# Patient Record
Sex: Female | Born: 1965
Health system: Southern US, Community
[De-identification: ages and names within clinical notes are randomized; demographics above are authoritative.]

## PROBLEM LIST (undated history)

## (undated) DIAGNOSIS — E782 Mixed hyperlipidemia: Secondary | ICD-10-CM

## (undated) DIAGNOSIS — D509 Iron deficiency anemia, unspecified: Secondary | ICD-10-CM

## (undated) DIAGNOSIS — M2550 Pain in unspecified joint: Secondary | ICD-10-CM

## (undated) DIAGNOSIS — M549 Dorsalgia, unspecified: Secondary | ICD-10-CM

## (undated) DIAGNOSIS — F419 Anxiety disorder, unspecified: Secondary | ICD-10-CM

## (undated) DIAGNOSIS — F32A Depression, unspecified: Secondary | ICD-10-CM

## (undated) DIAGNOSIS — K219 Gastro-esophageal reflux disease without esophagitis: Secondary | ICD-10-CM

## (undated) DIAGNOSIS — F40232 Fear of other medical care: Secondary | ICD-10-CM

## (undated) DIAGNOSIS — F329 Major depressive disorder, single episode, unspecified: Secondary | ICD-10-CM

## (undated) HISTORY — DX: Gastro-esophageal reflux disease without esophagitis: K21.9

## (undated) HISTORY — DX: Pain in unspecified joint: M25.50

## (undated) HISTORY — DX: Major depressive disorder, single episode, unspecified: F32.9

## (undated) HISTORY — DX: Fear of other medical care: F40.232

## (undated) HISTORY — DX: Mixed hyperlipidemia: E78.2

## (undated) HISTORY — DX: Anxiety disorder, unspecified: F41.9

## (undated) HISTORY — DX: Dorsalgia, unspecified: M54.9

## (undated) HISTORY — DX: Depression, unspecified: F32.A

## (undated) HISTORY — DX: Iron deficiency anemia, unspecified: D50.9

---

## 1999-09-06 ENCOUNTER — Other Ambulatory Visit: Admission: RE | Admit: 1999-09-06 | Discharge: 1999-09-06 | Payer: Self-pay | Admitting: Obstetrics and Gynecology

## 1999-10-19 ENCOUNTER — Encounter: Admission: RE | Admit: 1999-10-19 | Discharge: 2000-01-17 | Payer: Self-pay | Admitting: Obstetrics and Gynecology

## 2000-09-05 ENCOUNTER — Other Ambulatory Visit: Admission: RE | Admit: 2000-09-05 | Discharge: 2000-09-05 | Payer: Self-pay | Admitting: Obstetrics and Gynecology

## 2001-09-10 ENCOUNTER — Other Ambulatory Visit: Admission: RE | Admit: 2001-09-10 | Discharge: 2001-09-10 | Payer: Self-pay | Admitting: Obstetrics and Gynecology

## 2002-09-30 ENCOUNTER — Other Ambulatory Visit: Admission: RE | Admit: 2002-09-30 | Discharge: 2002-09-30 | Payer: Self-pay | Admitting: Obstetrics and Gynecology

## 2003-10-17 ENCOUNTER — Other Ambulatory Visit: Admission: RE | Admit: 2003-10-17 | Discharge: 2003-10-17 | Payer: Self-pay | Admitting: Obstetrics and Gynecology

## 2003-10-24 ENCOUNTER — Ambulatory Visit (HOSPITAL_COMMUNITY): Admission: RE | Admit: 2003-10-24 | Discharge: 2003-10-24 | Payer: Self-pay | Admitting: Obstetrics and Gynecology

## 2004-10-19 ENCOUNTER — Other Ambulatory Visit: Admission: RE | Admit: 2004-10-19 | Discharge: 2004-10-19 | Payer: Self-pay | Admitting: Obstetrics and Gynecology

## 2009-09-16 ENCOUNTER — Encounter: Admission: RE | Admit: 2009-09-16 | Discharge: 2009-09-16 | Payer: Self-pay | Admitting: Family Medicine

## 2010-09-21 ENCOUNTER — Other Ambulatory Visit (HOSPITAL_COMMUNITY): Payer: Self-pay | Admitting: Family Medicine

## 2010-09-21 DIAGNOSIS — Z1231 Encounter for screening mammogram for malignant neoplasm of breast: Secondary | ICD-10-CM

## 2010-09-30 ENCOUNTER — Ambulatory Visit (HOSPITAL_COMMUNITY)
Admission: RE | Admit: 2010-09-30 | Discharge: 2010-09-30 | Disposition: A | Discharge status: Home / Self Care | Payer: 59 | Source: Ambulatory Visit | Attending: Family Medicine | Admitting: Family Medicine

## 2010-09-30 DIAGNOSIS — Z1231 Encounter for screening mammogram for malignant neoplasm of breast: Secondary | ICD-10-CM | POA: Insufficient documentation

## 2011-03-15 IMAGING — US US ABDOMEN COMPLETE
1 series · 14 of 25 positions shown · non-contrast
Comparison: None.

CLINICAL DATA: Elevated LFTs.

ABDOMINAL ULTRASOUND COMPLETE

[Series 1: us abdomen complete · 0.23mm/px · 14 of 59 slices shown]
[im 1/59]
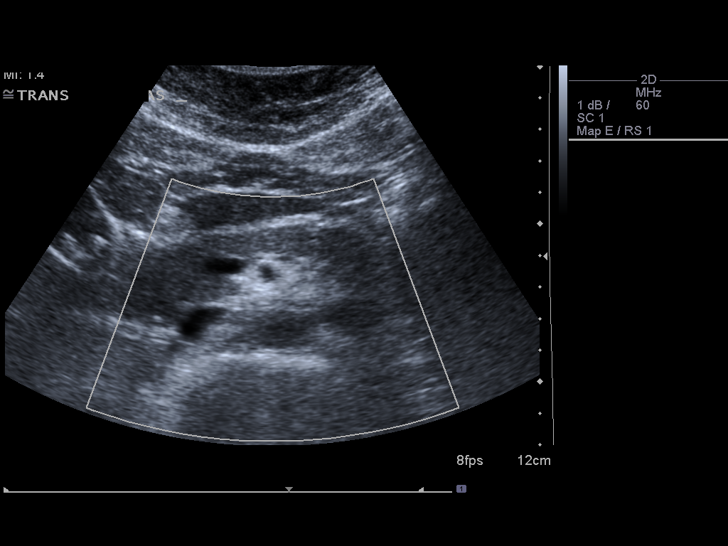
[im 5/59]
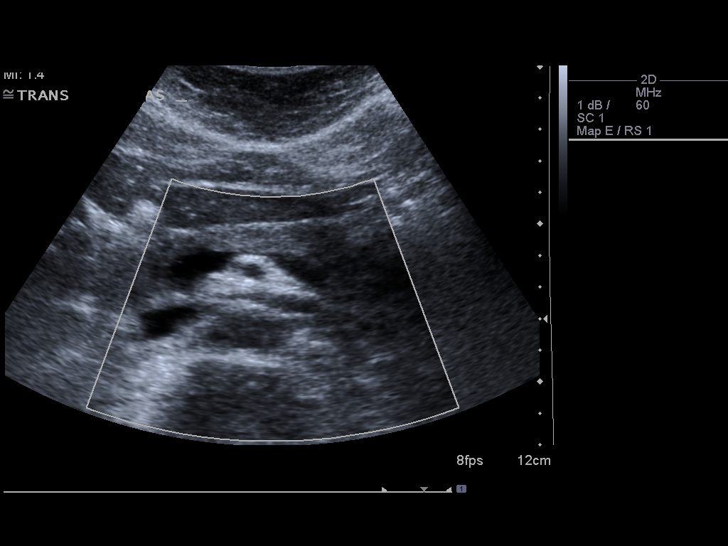
[im 10/59]
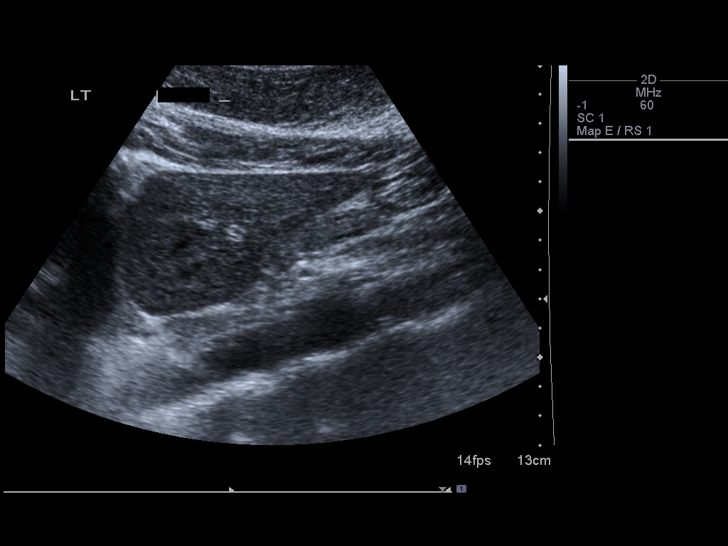
[im 15/59]
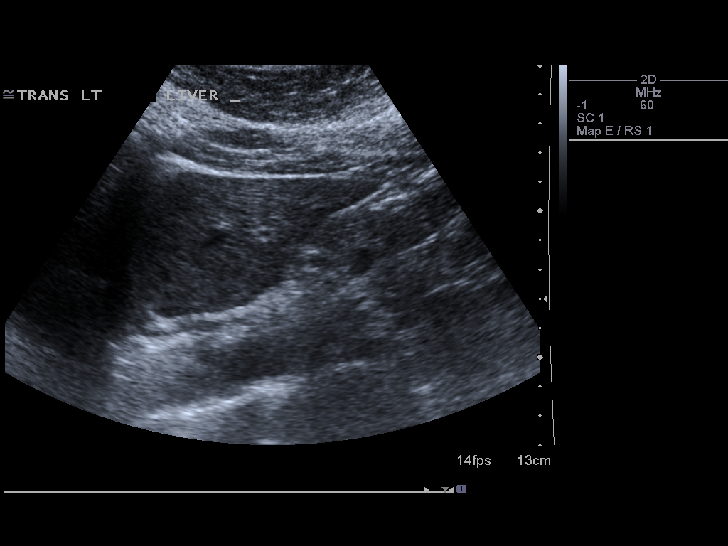
[im 20/59]
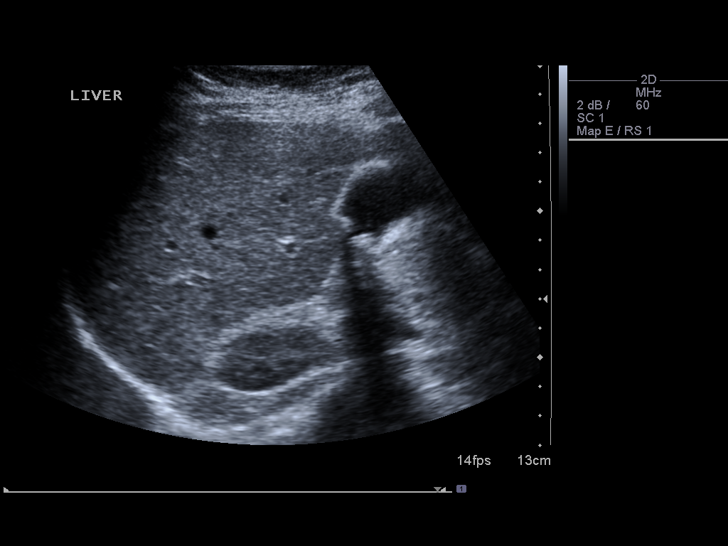
[im 22/59]
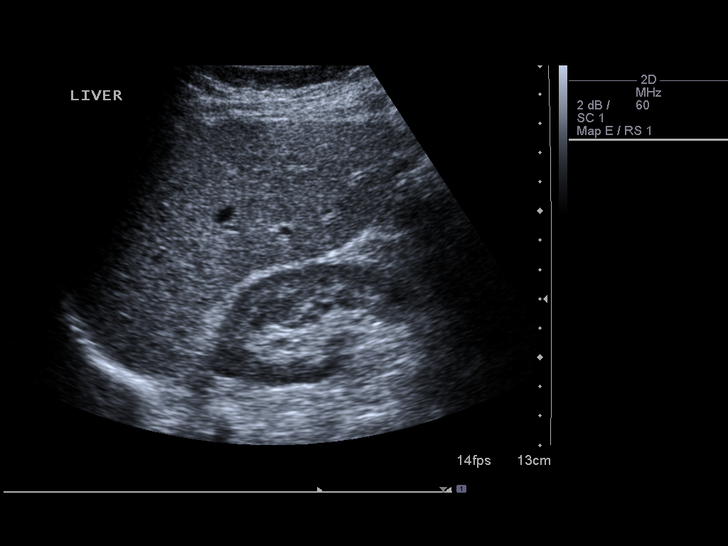
[im 27/59]
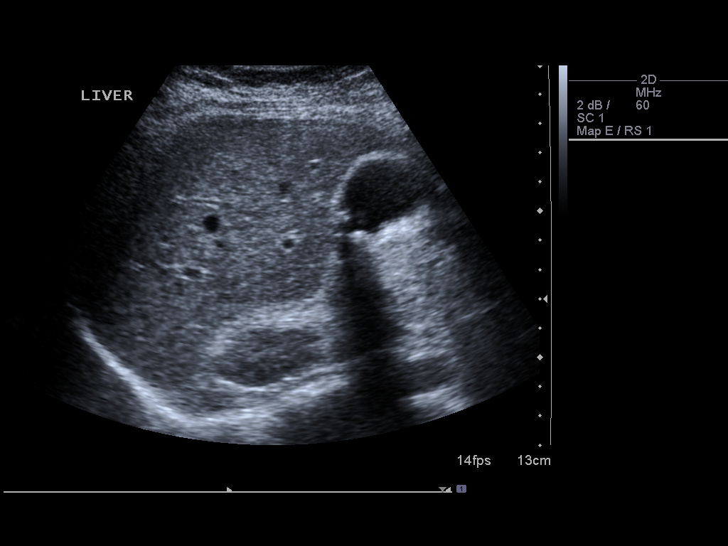
[im 32/59]
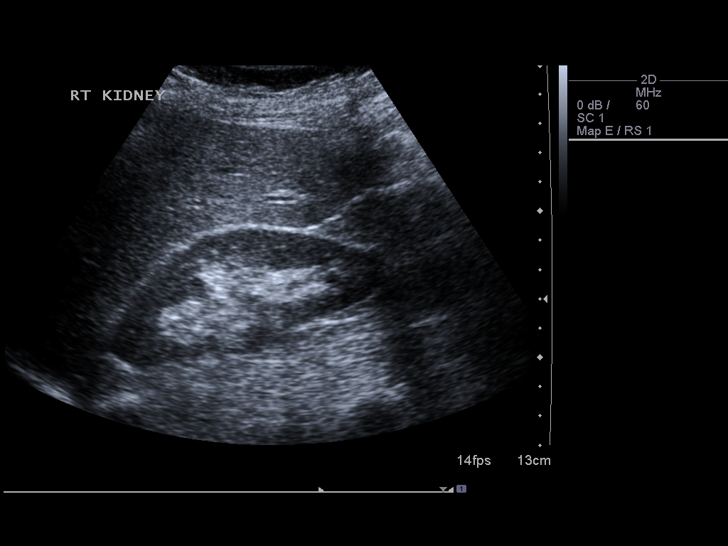
[im 37/59]
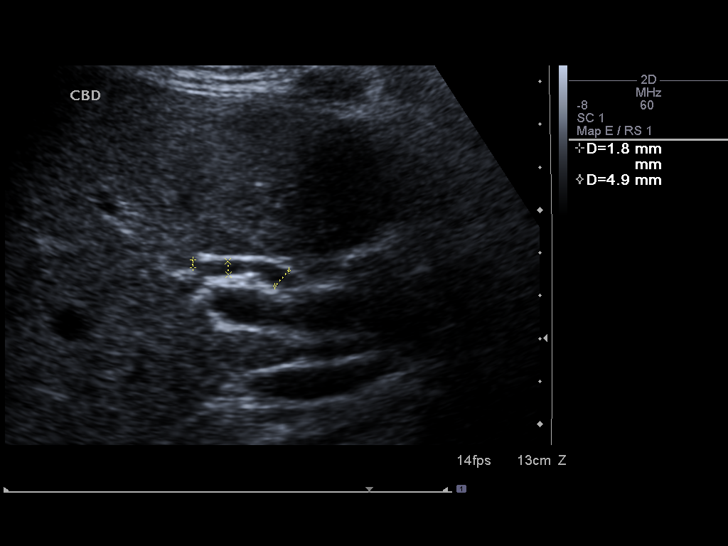
[im 39/59]
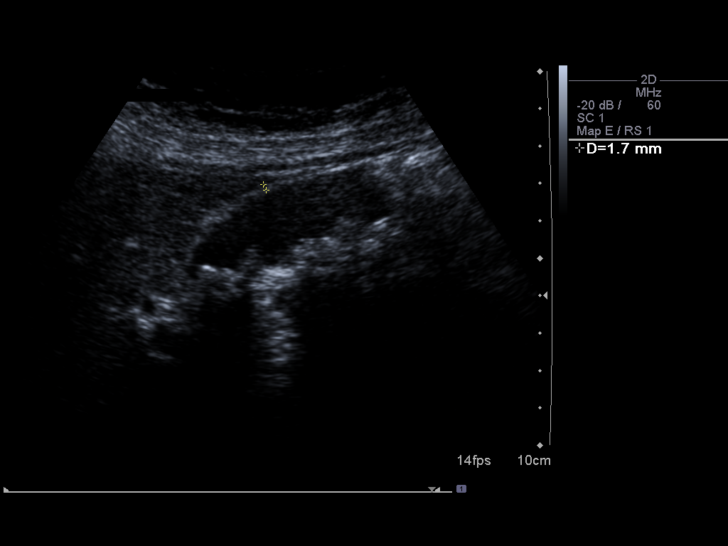
[im 44/59]
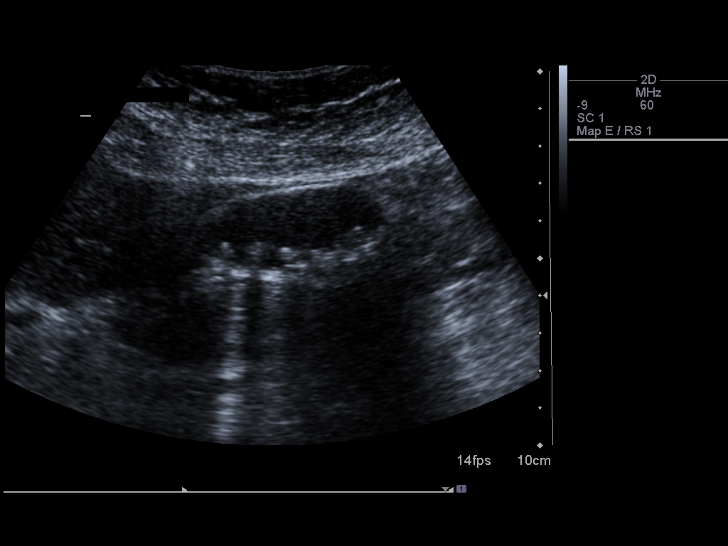
[im 49/59]
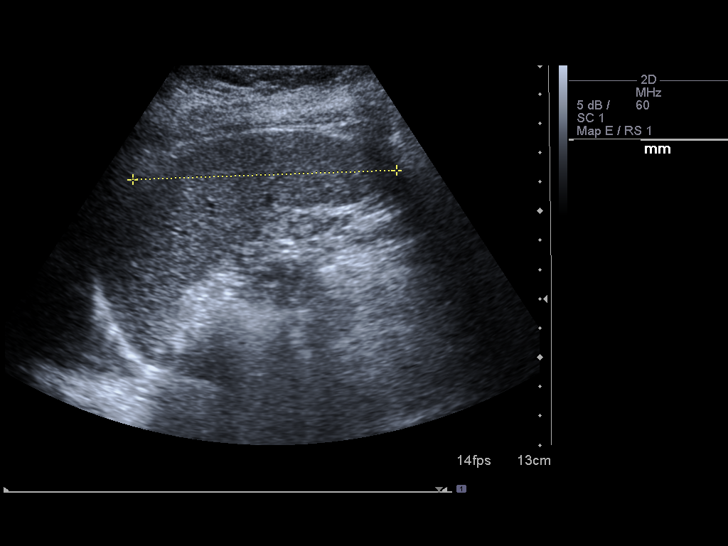
[im 54/59]
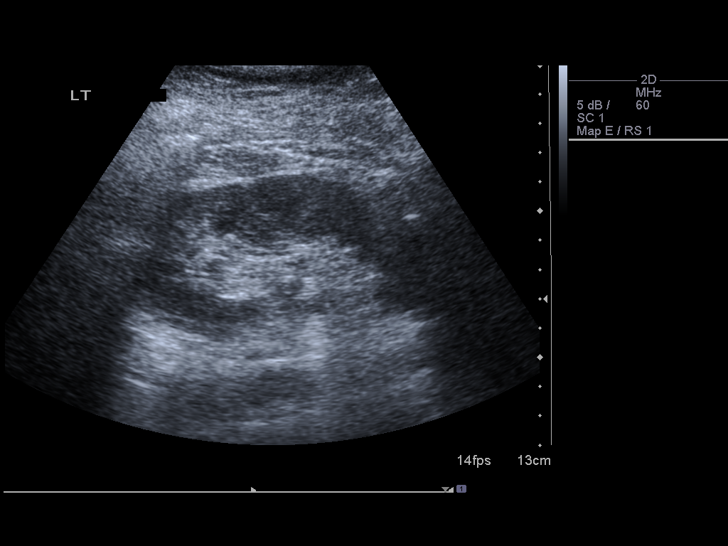
[im 59/59]
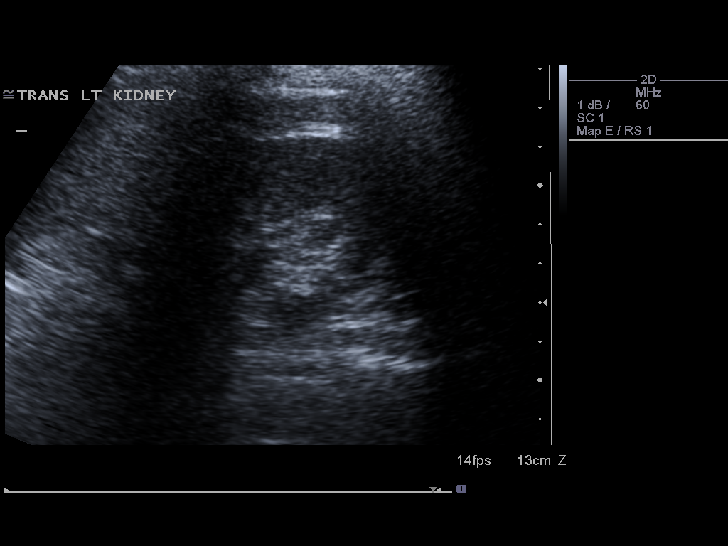

[14 of 25 positions shown; findings below may reference images not displayed]

FINDINGS: Gallbladder: Multiple gallstones.  Negative ultrasound Murphy's
sign.

Common Bile Duct:  Within normal limits in caliber. 4.9 mm CBD
diameter.

Liver:  No focal mass lesion identified.  Within normal limits in
parenchymal echogenicity.

IVC:  Appears normal.

Pancreas:  No abnormality identified, although entire pancreas
cannot be visualized by ultrasound.

Spleen:  Within normal limits in size and echotexture. 9.0 cm
splenic length.

Right kidney:  Normal in size (renal length is 9.6 cm) and
parenchymal echogenicity.  No evidence of mass or hydronephrosis.

Left kidney:  Normal in size (renal length is 10.4 cm) and
parenchymal echogenicity.  No evidence of mass or hydronephrosis.

Abdominal Aorta:  No aneurysm identified. Maximum diameter of the
abdominal aorta is 1.9 cm.
IMPRESSION: Cholelithiasis.  No biliary ductal dilatation.

## 2011-11-29 ENCOUNTER — Other Ambulatory Visit (HOSPITAL_COMMUNITY): Payer: Self-pay | Admitting: Family Medicine

## 2011-11-29 DIAGNOSIS — Z1231 Encounter for screening mammogram for malignant neoplasm of breast: Secondary | ICD-10-CM

## 2011-12-14 ENCOUNTER — Ambulatory Visit (HOSPITAL_COMMUNITY)
Admission: RE | Admit: 2011-12-14 | Discharge: 2011-12-14 | Disposition: A | Discharge status: Home / Self Care | Payer: 59 | Source: Ambulatory Visit | Attending: Family Medicine | Admitting: Family Medicine

## 2011-12-14 DIAGNOSIS — Z1231 Encounter for screening mammogram for malignant neoplasm of breast: Secondary | ICD-10-CM | POA: Insufficient documentation

## 2012-12-12 ENCOUNTER — Other Ambulatory Visit (HOSPITAL_COMMUNITY): Payer: Self-pay | Admitting: Family Medicine

## 2012-12-12 DIAGNOSIS — Z1231 Encounter for screening mammogram for malignant neoplasm of breast: Secondary | ICD-10-CM

## 2012-12-25 ENCOUNTER — Ambulatory Visit (HOSPITAL_COMMUNITY)
Admission: RE | Admit: 2012-12-25 | Discharge: 2012-12-25 | Disposition: A | Discharge status: Home / Self Care | Payer: 59 | Source: Ambulatory Visit | Attending: Family Medicine | Admitting: Family Medicine

## 2012-12-25 DIAGNOSIS — Z1231 Encounter for screening mammogram for malignant neoplasm of breast: Secondary | ICD-10-CM

## 2014-01-15 ENCOUNTER — Other Ambulatory Visit (HOSPITAL_COMMUNITY): Payer: Self-pay | Admitting: Family Medicine

## 2014-01-15 DIAGNOSIS — Z1231 Encounter for screening mammogram for malignant neoplasm of breast: Secondary | ICD-10-CM

## 2014-01-28 ENCOUNTER — Ambulatory Visit (HOSPITAL_COMMUNITY)
Admission: RE | Admit: 2014-01-28 | Discharge: 2014-01-28 | Disposition: A | Discharge status: Home / Self Care | Payer: 59 | Source: Ambulatory Visit | Attending: Family Medicine | Admitting: Family Medicine

## 2014-01-28 DIAGNOSIS — Z1231 Encounter for screening mammogram for malignant neoplasm of breast: Secondary | ICD-10-CM

## 2015-01-20 ENCOUNTER — Other Ambulatory Visit: Payer: Self-pay | Admitting: Family Medicine

## 2015-01-20 DIAGNOSIS — Z1231 Encounter for screening mammogram for malignant neoplasm of breast: Secondary | ICD-10-CM

## 2015-02-02 ENCOUNTER — Ambulatory Visit
Admission: RE | Admit: 2015-02-02 | Discharge: 2015-02-02 | Disposition: A | Discharge status: Home / Self Care | Payer: 59 | Source: Ambulatory Visit | Attending: Family Medicine | Admitting: Family Medicine

## 2015-02-02 DIAGNOSIS — Z1231 Encounter for screening mammogram for malignant neoplasm of breast: Secondary | ICD-10-CM

## 2015-02-04 ENCOUNTER — Other Ambulatory Visit: Payer: Self-pay | Admitting: Family Medicine

## 2015-02-04 DIAGNOSIS — R928 Other abnormal and inconclusive findings on diagnostic imaging of breast: Secondary | ICD-10-CM

## 2015-02-11 ENCOUNTER — Ambulatory Visit
Admission: RE | Admit: 2015-02-11 | Discharge: 2015-02-11 | Disposition: A | Discharge status: Home / Self Care | Payer: 59 | Source: Ambulatory Visit | Attending: Family Medicine | Admitting: Family Medicine

## 2015-02-11 DIAGNOSIS — R928 Other abnormal and inconclusive findings on diagnostic imaging of breast: Secondary | ICD-10-CM

## 2015-09-19 DIAGNOSIS — F32A Depression, unspecified: Secondary | ICD-10-CM | POA: Insufficient documentation

## 2015-09-19 DIAGNOSIS — F419 Anxiety disorder, unspecified: Secondary | ICD-10-CM | POA: Insufficient documentation

## 2015-09-19 DIAGNOSIS — F329 Major depressive disorder, single episode, unspecified: Secondary | ICD-10-CM | POA: Insufficient documentation

## 2016-03-29 ENCOUNTER — Other Ambulatory Visit: Payer: Self-pay | Admitting: Family Medicine

## 2016-03-29 DIAGNOSIS — Z1231 Encounter for screening mammogram for malignant neoplasm of breast: Secondary | ICD-10-CM

## 2016-04-19 ENCOUNTER — Ambulatory Visit: Payer: 59

## 2016-04-19 ENCOUNTER — Ambulatory Visit
Admission: RE | Admit: 2016-04-19 | Discharge: 2016-04-19 | Disposition: A | Discharge status: Home / Self Care | Payer: 59 | Source: Ambulatory Visit | Attending: Family Medicine | Admitting: Family Medicine

## 2016-04-19 DIAGNOSIS — Z1231 Encounter for screening mammogram for malignant neoplasm of breast: Secondary | ICD-10-CM

## 2017-11-05 NOTE — Progress Notes (Signed)
Melanie Washington is a 52 y.o. female is here to [facility] CARE.   Patient Care Team: Helane Rima, DO as PCP - General (Family Medicine)   History of Present Illness:   HPI: Patient comes in today to establish care. History was reviewed in depth today. Fearful of doctors. No PAP > 5 years. Will consider doing in October at CPE. See Assessment and Plan section for Problem Based Charting of issues discussed today.   Health Maintenance Due  Topic Date Due  . HIV Screening  12/05/1982  . TETANUS/TDAP  12/05/1986  . PAP SMEAR  12/04/1988   No flowsheet data found.  PMHx, SurgHx, SocialHx, Medications, and Allergies were reviewed in the Visit Navigator and updated as appropriate.   Past Medical History:  Diagnosis Date  . Anxiety   . Depression   . GERD (gastroesophageal reflux disease)   . Iron deficiency anemia   . Mixed hyperlipidemia      Past Surgical History:  Procedure Laterality Date  . CESAREAN SECTION       Family History  Problem Relation Age of Onset  . Hyperlipidemia Mother   . Hypertension Mother   . Cancer Father   . Early death Father   . Hyperlipidemia Father   . Hypertension Father   . Stroke Father   . Asthma Brother   . Depression Brother   . COPD Maternal Grandmother   . Alcohol abuse Maternal Grandfather   . Cancer Maternal Grandfather   . Early death Maternal Grandfather   . Hyperlipidemia Maternal Grandfather   . Alcohol abuse Paternal Grandmother   . COPD Paternal Grandmother   . Alcohol abuse Paternal Grandfather     Social History   Tobacco Use  . Smoking status: Never Smoker  . Smokeless tobacco: Never Used  Substance Use Topics  . Alcohol use: Not Currently  . Drug use: Not Currently    Current Medications and Allergies:   Current Outpatient Medications:  .  buPROPion (WELLBUTRIN XL) 300 MG 24 hr tablet, Take 300 mg by mouth daily., Disp: , Rfl:  .  fexofenadine (ALLEGRA) 60 MG tablet, Take by mouth., Disp: , Rfl:  .   fluticasone (FLONASE) 50 MCG/ACT nasal spray, Place into the nose., Disp: , Rfl:  .  omeprazole (PRILOSEC) 20 MG capsule, Take 20 mg by mouth daily., Disp: , Rfl:  .  vortioxetine HBr (TRINTELLIX) 10 MG TABS tablet, Take one pill per day, Disp: , Rfl:   No Known Allergies Review of Systems:   Pertinent items are noted in the HPI. Otherwise, ROS is negative.  Vitals:   Vitals:   11/06/17 0808  BP: (!) 154/92  Pulse: 84  Temp: 98.5 F (36.9 C)  TempSrc: Oral  SpO2: 98%  Weight: 165 lb 6.4 oz (75 kg)  Height: 5\' 1"  (1.549 m)     Body mass index is 31.25 kg/m.  Physical Exam:   Physical Exam  Constitutional: She appears well-nourished.  HENT:  Head: Normocephalic and atraumatic.  Eyes: Pupils are equal, round, and reactive to light. EOM are normal.  Neck: Normal range of motion. Neck supple.  Cardiovascular: Normal rate, regular rhythm, normal heart sounds and intact distal pulses.  Pulmonary/Chest: Effort normal.  Abdominal: Soft.  Skin: Skin is warm.  Psychiatric: She has a normal mood and affect. Her behavior is normal.  Nursing note and vitals reviewed.   No results found for this or any previous visit.  Assessment and Plan:   Shizue was seen  today for establish care.  Diagnoses and all orders for this visit:  Microcytic anemia Comments: Hx of. Will obtain new labs at next visit - CPE with PAP in October.   Gastroesophageal reflux disease without esophagitis Comments: Controlled with PPI.  Mixed hyperlipidemia Comments: On no medication.  Need for Tdap vaccination Comments: Okay to get today since she has a new grandchild.  Orders: -     Tdap vaccine greater than or equal to 7yo IM  Dysthymia Comments: Stable on Trintellix. Others caused significant weight gain.   Anxiety Comments: Stable at this time. Tried therapy at one time without improvement.   Fear of medical care Comments: Fear of doctors, injections.     . Reviewed expectations  re: course of current medical issues. . Discussed self-management of symptoms. . Outlined signs and symptoms indicating need for more acute intervention. . Patient verbalized understanding and all questions were answered. Marland Kitchen. Health Maintenance issues including appropriate healthy diet, exercise, and smoking avoidance were discussed with patient. . See orders for this visit as documented in the electronic medical record. . Patient received an After Visit Summary.  Helane RimaErica Gilman Olazabal, DO [place] 11/06/2017  Records requested if needed. Time spent with the patient: 30 minutes, of which >50% was spent in obtaining information about her symptoms, reviewing her previous labs, evaluations, and treatments, counseling her about her condition (please see the discussed topics above), and developing a plan to further investigate it; she had a number of questions which I addressed.

## 2017-11-06 ENCOUNTER — Ambulatory Visit: Payer: 59 | Admitting: Family Medicine

## 2017-11-06 ENCOUNTER — Encounter: Payer: Self-pay | Admitting: Family Medicine

## 2017-11-06 VITALS — BP 154/92 | HR 84 | Temp 98.5°F | Ht 61.0 in | Wt 165.4 lb

## 2017-11-06 DIAGNOSIS — F419 Anxiety disorder, unspecified: Secondary | ICD-10-CM

## 2017-11-06 DIAGNOSIS — K219 Gastro-esophageal reflux disease without esophagitis: Secondary | ICD-10-CM | POA: Diagnosis not present

## 2017-11-06 DIAGNOSIS — F40232 Fear of other medical care: Secondary | ICD-10-CM

## 2017-11-06 DIAGNOSIS — Z23 Encounter for immunization: Secondary | ICD-10-CM

## 2017-11-06 DIAGNOSIS — E782 Mixed hyperlipidemia: Secondary | ICD-10-CM | POA: Diagnosis not present

## 2017-11-06 DIAGNOSIS — F341 Dysthymic disorder: Secondary | ICD-10-CM

## 2017-11-06 DIAGNOSIS — D509 Iron deficiency anemia, unspecified: Secondary | ICD-10-CM

## 2017-11-18 DIAGNOSIS — H66001 Acute suppurative otitis media without spontaneous rupture of ear drum, right ear: Secondary | ICD-10-CM | POA: Diagnosis not present

## 2018-02-06 NOTE — Progress Notes (Signed)
Subjective:    Melanie Washington is a 52 y.o. female and is here for a comprehensive physical exam.  Health Maintenance Due  Topic Date Due  . HIV Screening  12/05/1982  . INFLUENZA VACCINE  11/23/2017   Current Outpatient Medications:  .  buPROPion (WELLBUTRIN XL) 300 MG 24 hr tablet, Take 300 mg by mouth daily., Disp: , Rfl:  .  fexofenadine (ALLEGRA) 60 MG tablet, Take by mouth., Disp: , Rfl:  .  fluticasone (FLONASE) 50 MCG/ACT nasal spray, Place into the nose., Disp: , Rfl:  .  omeprazole (PRILOSEC) 20 MG capsule, Take 20 mg by mouth daily., Disp: , Rfl:  .  vortioxetine HBr (TRINTELLIX) 10 MG TABS tablet, Take one pill per day, Disp: , Rfl:   PMHx, SurgHx, SocialHx, Medications, and Allergies were reviewed in the Visit Navigator and updated as appropriate.   Past Medical History:  Diagnosis Date  . Anxiety   . Depression   . Fear of other medical care   . GERD (gastroesophageal reflux disease)   . Iron deficiency anemia   . Mixed hyperlipidemia      Past Surgical History:  Procedure Laterality Date  . CESAREAN SECTION       Family History  Problem Relation Age of Onset  . Hyperlipidemia Mother   . Hypertension Mother   . Cancer Father   . Early death Father   . Hyperlipidemia Father   . Hypertension Father   . Stroke Father   . Asthma Brother   . Depression Brother        Died by suicide at age 55  . COPD Maternal Grandmother   . Alcohol abuse Maternal Grandfather   . Cancer Maternal Grandfather   . Early death Maternal Grandfather   . Hyperlipidemia Maternal Grandfather   . Alcohol abuse Paternal Grandmother   . COPD Paternal Grandmother   . Alcohol abuse Paternal Grandfather     Social History   Tobacco Use  . Smoking status: Never Smoker  . Smokeless tobacco: Never Used  Substance Use Topics  . Alcohol use: Not Currently  . Drug use: Not Currently    Review of Systems:   Pertinent items are noted in the HPI. Otherwise, ROS is  negative.  Objective:   BP (!) 144/86   Pulse 96   Temp 98.2 F (36.8 C) (Oral)   Ht 5\' 1"  (1.549 m)   Wt 169 lb (76.7 kg)   LMP 11/07/2017   SpO2 98%   BMI 31.93 kg/m   General appearance: alert, cooperative and appears stated age. Head: normocephalic, without obvious abnormality, atraumatic. Neck: no adenopathy, supple, symmetrical, trachea midline; thyroid not enlarged, symmetric, no tenderness/mass/nodules. Lungs: clear to auscultation bilaterally. Heart: regular rate and rhythm Abdomen: soft, non-tender; no masses,  no organomegaly. Extremities: extremities normal, atraumatic, no cyanosis or edema. Skin: skin color, texture, turgor normal, no rashes or lesions. Lymph: cervical, supraclavicular, and axillary nodes normal; no abnormal inguinal nodes palpated. Neurologic: grossly normal.                                      Assessment/Plan:   Melanie Washington was seen today for annual exam.  Diagnoses and all orders for this visit:  Routine physical examination  Mixed hyperlipidemia -     Comprehensive metabolic panel -     Lipid panel  Screening for HIV (human immunodeficiency virus) -  HIV Antibody (routine testing w rflx)  Microcytic anemia -     CBC with Differential/Platelet -     Iron, TIBC and Ferritin Panel  Subacute maxillary sinusitis -     amoxicillin (AMOXIL) 875 MG tablet; Take 1 tablet (875 mg total) by mouth 2 (two) times daily.   Patient Counseling: [x]    Nutrition: Stressed importance of moderation in sodium/caffeine intake, saturated fat and cholesterol, caloric balance, sufficient intake of fresh fruits, vegetables, fiber, calcium, iron, and 1 mg of folate supplement per day (for females capable of pregnancy).  [x]    Stressed the importance of regular exercise.   [x]    Substance Abuse: Discussed cessation/primary prevention of tobacco, alcohol, or other drug use; driving or other dangerous activities under the influence; availability of treatment for  abuse.   [x]    Injury prevention: Discussed safety belts, safety helmets, smoke detector, smoking near bedding or upholstery.   [x]    Sexuality: Discussed sexually transmitted diseases, partner selection, use of condoms, avoidance of unintended pregnancy  and contraceptive alternatives.  [x]    Dental health: Discussed importance of regular tooth brushing, flossing, and dental visits.  [x]    Health maintenance and immunizations reviewed. Please refer to Health maintenance section.   Helane Rima, DO  [facility]

## 2018-02-07 ENCOUNTER — Encounter: Payer: Self-pay | Admitting: Family Medicine

## 2018-02-07 ENCOUNTER — Ambulatory Visit (INDEPENDENT_AMBULATORY_CARE_PROVIDER_SITE_OTHER): Payer: 59 | Admitting: Family Medicine

## 2018-02-07 VITALS — BP 144/86 | HR 96 | Temp 98.2°F | Ht 61.0 in | Wt 169.0 lb

## 2018-02-07 DIAGNOSIS — Z0000 Encounter for general adult medical examination without abnormal findings: Secondary | ICD-10-CM

## 2018-02-07 DIAGNOSIS — Z23 Encounter for immunization: Secondary | ICD-10-CM | POA: Diagnosis not present

## 2018-02-07 DIAGNOSIS — E782 Mixed hyperlipidemia: Secondary | ICD-10-CM | POA: Diagnosis not present

## 2018-02-07 DIAGNOSIS — Z1211 Encounter for screening for malignant neoplasm of colon: Secondary | ICD-10-CM

## 2018-02-07 DIAGNOSIS — D509 Iron deficiency anemia, unspecified: Secondary | ICD-10-CM | POA: Diagnosis not present

## 2018-02-07 DIAGNOSIS — J0100 Acute maxillary sinusitis, unspecified: Secondary | ICD-10-CM

## 2018-02-07 DIAGNOSIS — Z114 Encounter for screening for human immunodeficiency virus [HIV]: Secondary | ICD-10-CM

## 2018-02-07 LAB — CBC WITH DIFFERENTIAL/PLATELET
Basophils Absolute: 0 10*3/uL (ref 0.0–0.1)
Basophils Relative: 0.6 % (ref 0.0–3.0)
Eosinophils Absolute: 0.5 10*3/uL (ref 0.0–0.7)
Eosinophils Relative: 6.5 % — ABNORMAL HIGH (ref 0.0–5.0)
HCT: 33.6 % — ABNORMAL LOW (ref 36.0–46.0)
Hemoglobin: 10.7 g/dL — ABNORMAL LOW (ref 12.0–15.0)
Lymphocytes Relative: 31.2 % (ref 12.0–46.0)
Lymphs Abs: 2.3 10*3/uL (ref 0.7–4.0)
MCHC: 31.8 g/dL (ref 30.0–36.0)
MCV: 77.9 fl — ABNORMAL LOW (ref 78.0–100.0)
Monocytes Absolute: 0.6 10*3/uL (ref 0.1–1.0)
Monocytes Relative: 8.2 % (ref 3.0–12.0)
Neutro Abs: 4 10*3/uL (ref 1.4–7.7)
Neutrophils Relative %: 53.5 % (ref 43.0–77.0)
Platelets: 365 10*3/uL (ref 150.0–400.0)
RBC: 4.31 Mil/uL (ref 3.87–5.11)
RDW: 18.3 % — ABNORMAL HIGH (ref 11.5–15.5)
WBC: 7.5 10*3/uL (ref 4.0–10.5)

## 2018-02-07 LAB — LIPID PANEL
Cholesterol: 179 mg/dL (ref 0–200)
HDL: 38.5 mg/dL — ABNORMAL LOW (ref >39.00)
LDL Cholesterol: 123 mg/dL — ABNORMAL HIGH (ref 0–99)
NonHDL: 140.98
Total CHOL/HDL Ratio: 5
Triglycerides: 91 mg/dL (ref 0.0–149.0)
VLDL: 18.2 mg/dL (ref 0.0–40.0)

## 2018-02-07 LAB — COMPREHENSIVE METABOLIC PANEL
ALT: 19 U/L (ref 0–35)
AST: 15 U/L (ref 0–37)
Albumin: 4 g/dL (ref 3.5–5.2)
Alkaline Phosphatase: 112 U/L (ref 39–117)
BUN: 13 mg/dL (ref 6–23)
CO2: 28 mEq/L (ref 19–32)
Calcium: 9.2 mg/dL (ref 8.4–10.5)
Chloride: 103 mEq/L (ref 96–112)
Creatinine, Ser: 0.77 mg/dL (ref 0.40–1.20)
GFR: 84.28 mL/min (ref >60.00)
Glucose, Bld: 100 mg/dL — ABNORMAL HIGH (ref 70–99)
Potassium: 4.3 mEq/L (ref 3.5–5.1)
Sodium: 140 mEq/L (ref 135–145)
Total Bilirubin: 0.3 mg/dL (ref 0.2–1.2)
Total Protein: 7.6 g/dL (ref 6.0–8.3)

## 2018-02-07 MED ORDER — AMOXICILLIN 875 MG PO TABS: 875.0000 mg | ORAL_TABLET | Freq: Two times a day (BID) | ORAL | 0 refills | Status: DC

## 2018-02-08 LAB — IRON,TIBC AND FERRITIN PANEL
%SAT: 6 % (calc) — ABNORMAL LOW (ref 16–45)
Ferritin: 15 ng/mL — ABNORMAL LOW (ref 16–232)
Iron: 23 ug/dL — ABNORMAL LOW (ref 45–160)
TIBC: 389 mcg/dL (calc) (ref 250–450)

## 2018-02-08 LAB — HIV ANTIBODY (ROUTINE TESTING W REFLEX): HIV 1&2 Ab, 4th Generation: NONREACTIVE

## 2018-07-15 ENCOUNTER — Encounter: Payer: Self-pay | Admitting: Family Medicine

## 2018-07-18 ENCOUNTER — Other Ambulatory Visit: Payer: Self-pay | Admitting: *Deleted

## 2018-07-18 MED ORDER — OMEPRAZOLE 20 MG PO CPDR: 20.0000 mg | DELAYED_RELEASE_CAPSULE | Freq: Every day | ORAL | 3 refills | Status: DC

## 2018-07-18 MED ORDER — VORTIOXETINE HBR 10 MG PO TABS: ORAL_TABLET | ORAL | 1 refills | Status: DC

## 2018-07-18 MED ORDER — BUPROPION HCL ER (XL) 300 MG PO TB24: 300.0000 mg | ORAL_TABLET | Freq: Every day | ORAL | 1 refills | Status: DC

## 2018-08-08 ENCOUNTER — Encounter: Payer: Self-pay | Admitting: Family Medicine

## 2018-08-09 NOTE — Progress Notes (Signed)
Virtual Visit via Video   I connected with Melanie Washington on 08/10/18 at  8:00 AM EDT by a video enabled telemedicine application and verified that I am speaking with the correct person using two identifiers. Location patient: Home Location provider: [facility], Office Persons participating in the virtual visit: ANTHIA GRAJEDA, Helane Rima, DO Barnie Mort, CMA acting as scribe for Dr. Helane Rima.   I discussed the limitations of evaluation and management by telemedicine and the availability of in person appointments. The patient expressed understanding and agreed to proceed.  Subjective:   HPI: Patient is in for 6 month follow up. She is still working. She does have a mask and is practicing social distancing.   Prilosec is helping with the reflux. She has been taking Iron supplement daily and noticed change in energy level. She was informed that we will do labs. She has not checked her vitals but she has not had any symptoms of any high blood pressure.   Reviewed all precautions and expectations with prevention of Covid-19.   ROS: See pertinent positives and negatives per HPI.  Patient Active Problem List   Diagnosis Date Noted  . Microcytic anemia 11/06/2017  . Gastroesophageal reflux disease without esophagitis 11/06/2017  . Mixed hyperlipidemia 11/06/2017  . Fear of medical care 11/06/2017    Social History   Tobacco Use  . Smoking status: Never Smoker  . Smokeless tobacco: Never Used  Substance Use Topics  . Alcohol use: Not Currently   Current Outpatient Medications:  .  buPROPion (WELLBUTRIN XL) 300 MG 24 hr tablet, Take 1 tablet (300 mg total) by mouth daily., Disp: 90 tablet, Rfl: 1 .  fexofenadine (ALLEGRA) 60 MG tablet, Take by mouth., Disp: , Rfl:  .  fluticasone (FLONASE) 50 MCG/ACT nasal spray, Place into the nose., Disp: , Rfl:  .  omeprazole (PRILOSEC) 20 MG capsule, Take 1 capsule (20 mg total) by mouth daily., Disp: 90 capsule, Rfl: 3 .   vortioxetine HBr (TRINTELLIX) 10 MG TABS tablet, Take one pill per day, Disp: 90 tablet, Rfl: 1  No Known Allergies  Objective:   VITALS: Per patient if applicable, see vitals. GENERAL: Alert, appears well and in no acute distress. HEENT: Atraumatic, conjunctiva clear, no obvious abnormalities on inspection of external nose and ears. NECK: Normal movements of the head and neck. CARDIOPULMONARY: No increased WOB. Speaking in clear sentences. I:E ratio WNL.  MS: Moves all visible extremities without noticeable abnormality. PSYCH: Pleasant and cooperative, well-groomed. Speech normal rate and rhythm. Affect is appropriate. Insight and judgement are appropriate. Attention is focused, linear, and appropriate.  NEURO: CN grossly intact. Oriented as arrived to appointment on time with no prompting. Moves both UE equally.  SKIN: No obvious lesions, wounds, erythema, or cyanosis noted on face or hands.  Assessment and Plan:   Melanie Washington was seen today for follow-up.  Diagnoses and all orders for this visit:  Microcytic anemia Comments: Feeling more energetic. Taking iron. Will recheck labs at next visit.   Gastroesophageal reflux disease without esophagitis Comments: Controlled. Continue current regimen. Orders: -     omeprazole (PRILOSEC) 20 MG capsule; Take 1 capsule (20 mg total) by mouth daily.  Dysthymia Comments: Controlled. Continue current regimen.  Orders: -     vortioxetine HBr (TRINTELLIX) 10 MG TABS tablet; Take one pill per day -     buPROPion (WELLBUTRIN XL) 300 MG 24 hr tablet; Take 1 tablet (300 mg total) by mouth daily.  Educated About Covid-19  Virus Infection    . Reviewed expectations re: course of current medical issues. . Discussed self-management of symptoms. . Outlined signs and symptoms indicating need for more acute intervention. . Patient verbalized understanding and all questions were answered. Marland Kitchen. Health Maintenance issues including appropriate healthy diet,  exercise, and smoking avoidance were discussed with patient. . See orders for this visit as documented in the electronic medical record.  Helane RimaErica Yzabelle Calles, DO 08/10/2018   Records requested if needed. Time spent: 25 minutes, of which >50% was spent in obtaining information about her symptoms, reviewing her previous labs, evaluations, and treatments, counseling her about her condition (please see the discussed topics above), and developing a plan to further investigate it; she had a number of questions which I addressed.

## 2018-08-10 ENCOUNTER — Other Ambulatory Visit: Payer: Self-pay

## 2018-08-10 ENCOUNTER — Ambulatory Visit (INDEPENDENT_AMBULATORY_CARE_PROVIDER_SITE_OTHER): Payer: 59 | Admitting: Family Medicine

## 2018-08-10 ENCOUNTER — Ambulatory Visit: Payer: 59 | Admitting: Family Medicine

## 2018-08-10 ENCOUNTER — Encounter: Payer: Self-pay | Admitting: Family Medicine

## 2018-08-10 VITALS — Ht 61.0 in

## 2018-08-10 DIAGNOSIS — F341 Dysthymic disorder: Secondary | ICD-10-CM

## 2018-08-10 DIAGNOSIS — D509 Iron deficiency anemia, unspecified: Secondary | ICD-10-CM | POA: Diagnosis not present

## 2018-08-10 DIAGNOSIS — K219 Gastro-esophageal reflux disease without esophagitis: Secondary | ICD-10-CM | POA: Diagnosis not present

## 2018-08-10 DIAGNOSIS — Z7189 Other specified counseling: Secondary | ICD-10-CM

## 2018-08-10 MED ORDER — OMEPRAZOLE 20 MG PO CPDR
20.0000 mg | DELAYED_RELEASE_CAPSULE | Freq: Every day | ORAL | 3 refills | Status: DC
Start: 2018-08-10 — End: 2019-05-10

## 2018-08-10 MED ORDER — BUPROPION HCL ER (XL) 300 MG PO TB24: 300.0000 mg | ORAL_TABLET | Freq: Every day | ORAL | 1 refills | Status: DC

## 2018-08-10 MED ORDER — VORTIOXETINE HBR 10 MG PO TABS: ORAL_TABLET | ORAL | 1 refills | Status: DC

## 2018-09-28 ENCOUNTER — Telehealth: Payer: Self-pay | Admitting: Family Medicine

## 2018-09-28 NOTE — Telephone Encounter (Signed)
I have emailed charge corrections to resubmit the claim information below. No need to route, only for documentation only.

## 2018-09-28 NOTE — Telephone Encounter (Signed)
-----   Message from Frann Rider sent at 09/25/2018 12:03 PM EDT ----- Please email Charge Corrections to resubmit the claim for the following:   Patient Name: Melanie Washington GBM:[id number]  DOB:[birth year] Date of Service: 08/10/18  Insurance Name:[employer] / [employer]egino [employer]ult[employer] Insurance ID #:[id number]  Patient was advised that this process can take up to a week or more. Patient also was advised that they may receive additional bills while this is being looked into and they are to hold onto all statements until resolved.

## 2018-11-12 ENCOUNTER — Ambulatory Visit: Payer: Self-pay | Admitting: Family Medicine

## 2019-03-22 ENCOUNTER — Other Ambulatory Visit: Payer: Self-pay | Admitting: Family Medicine

## 2019-03-22 DIAGNOSIS — F341 Dysthymic disorder: Secondary | ICD-10-CM

## 2019-03-29 NOTE — Telephone Encounter (Signed)
Last OV 08/10/18 Last refill 08/10/18 #90/1 Next OV not scheduled  Please contact pt to schedule with new PCP, thanks!

## 2019-03-29 NOTE — Telephone Encounter (Signed)
Patietn is scheduled for 1/15 for an office visit for medication refills for buPROPion (WELLBUTRIN XL) 300 MG 24 hr tablet [Pharmacy Med Name: BUPROPION HCL XL TABS 300MG ] and a TOC/CPE appt in February

## 2019-04-01 ENCOUNTER — Other Ambulatory Visit: Payer: Self-pay | Admitting: Family Medicine

## 2019-04-01 DIAGNOSIS — F341 Dysthymic disorder: Secondary | ICD-10-CM

## 2019-04-01 MED ORDER — BUPROPION HCL ER (XL) 300 MG PO TB24: 300.0000 mg | ORAL_TABLET | Freq: Every day | ORAL | 1 refills | Status: DC

## 2019-04-01 NOTE — Addendum Note (Signed)
Addended by: Orma Flaming on: 04/01/2019 09:33 AM   Modules accepted: Orders

## 2019-05-09 ENCOUNTER — Other Ambulatory Visit: Payer: Self-pay

## 2019-05-10 ENCOUNTER — Ambulatory Visit (INDEPENDENT_AMBULATORY_CARE_PROVIDER_SITE_OTHER): Payer: 59 | Admitting: Family Medicine

## 2019-05-10 ENCOUNTER — Encounter: Payer: Self-pay | Admitting: Family Medicine

## 2019-05-10 VITALS — BP 162/98 | HR 83 | Temp 98.3°F | Ht 60.0 in | Wt 176.6 lb

## 2019-05-10 DIAGNOSIS — L309 Dermatitis, unspecified: Secondary | ICD-10-CM | POA: Diagnosis not present

## 2019-05-10 DIAGNOSIS — F411 Generalized anxiety disorder: Secondary | ICD-10-CM | POA: Insufficient documentation

## 2019-05-10 DIAGNOSIS — K219 Gastro-esophageal reflux disease without esophagitis: Secondary | ICD-10-CM | POA: Diagnosis not present

## 2019-05-10 DIAGNOSIS — F321 Major depressive disorder, single episode, moderate: Secondary | ICD-10-CM | POA: Diagnosis not present

## 2019-05-10 DIAGNOSIS — F341 Dysthymic disorder: Secondary | ICD-10-CM

## 2019-05-10 DIAGNOSIS — R030 Elevated blood-pressure reading, without diagnosis of hypertension: Secondary | ICD-10-CM

## 2019-05-10 MED ORDER — TRIAMCINOLONE ACETONIDE 0.1 % EX CREA: 1.0000 "application " | TOPICAL_CREAM | Freq: Two times a day (BID) | CUTANEOUS | 1 refills | Status: DC

## 2019-05-10 MED ORDER — OMEPRAZOLE 20 MG PO CPDR: 20.0000 mg | DELAYED_RELEASE_CAPSULE | Freq: Every day | ORAL | 3 refills | Status: DC

## 2019-05-10 MED ORDER — BUPROPION HCL ER (XL) 300 MG PO TB24: 300.0000 mg | ORAL_TABLET | Freq: Every day | ORAL | 1 refills | Status: DC

## 2019-05-10 MED ORDER — VORTIOXETINE HBR 10 MG PO TABS: 10.0000 mg | ORAL_TABLET | Freq: Every day | ORAL | 1 refills | Status: DC

## 2019-05-10 NOTE — Patient Instructions (Signed)
Rash on neck: looks like a dermatitis. I sent in steroid cream for you to use twice a day for 7-10 days then come off and use as needed. Can also use a good emollient cream as well like cetephil cream, eucerin, cereve.   Let me know if not getting better.  'see you in a month, don't eat for your labs!   Also get blood pressure cuff (arm) and keep log for me. Bring to next appointment.   Dr. Artis Flock
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
## 2019-06-04 ENCOUNTER — Other Ambulatory Visit: Payer: Self-pay

## 2019-06-04 DIAGNOSIS — K219 Gastro-esophageal reflux disease without esophagitis: Secondary | ICD-10-CM

## 2019-06-04 DIAGNOSIS — F341 Dysthymic disorder: Secondary | ICD-10-CM

## 2019-06-04 MED ORDER — BUPROPION HCL ER (XL) 300 MG PO TB24: 300.0000 mg | ORAL_TABLET | Freq: Every day | ORAL | 1 refills | Status: DC

## 2019-06-04 MED ORDER — OMEPRAZOLE 20 MG PO CPDR: 20.0000 mg | DELAYED_RELEASE_CAPSULE | Freq: Every day | ORAL | 3 refills | Status: DC

## 2019-06-04 NOTE — Progress Notes (Signed)
Wellbutrin and Prilosec Rx Refill sent to [facility] #[id number], also requesting  Trintellix 10mg  90 day supply. Approve?

## 2019-06-05 MED ORDER — VORTIOXETINE HBR 10 MG PO TABS: 10.0000 mg | ORAL_TABLET | Freq: Every day | ORAL | 1 refills | Status: DC

## 2019-06-05 NOTE — Progress Notes (Signed)
Sent in trintellix for her.  Kalya Troeger, MD [facility]   

## 2019-06-05 NOTE — Progress Notes (Signed)
Sent in trintellix for her.  Orland Mustard, MD [facility]

## 2019-06-06 ENCOUNTER — Telehealth: Payer: Self-pay

## 2019-06-06 NOTE — Telephone Encounter (Signed)
LVM for patient to call the office back about Prior Auth.

## 2019-06-07 ENCOUNTER — Telehealth: Payer: Self-pay | Admitting: Family Medicine

## 2019-06-07 ENCOUNTER — Encounter: Payer: 59 | Admitting: Family Medicine

## 2019-06-07 NOTE — Telephone Encounter (Signed)
Let her know since her trintellix is not approved we can do 2 things...  1) start an SSRI type drug like prozac that treats both depression and anxiety OR  2) start an anxiety drug called buspar that has similar pharmacological effects as trintellix, but is for anxiety and you take 2-3x/day.   OR just stay on wellbutrin alone  Let me know what she would be interested in  Dr. Artis Flock

## 2019-06-10 MED ORDER — BUSPIRONE HCL 7.5 MG PO TABS: 7.5000 mg | ORAL_TABLET | Freq: Three times a day (TID) | ORAL | 0 refills | Status: DC

## 2019-06-10 NOTE — Telephone Encounter (Signed)
Sent in buspar at 7.5mg  can take two to three times a day for anxiety. F/u with me in a month and let me know if any issues.  Orland Mustard, MD [facility]

## 2019-06-10 NOTE — Telephone Encounter (Signed)
Spoke with patient about medication choices, she is worried about weight gain side effects. She is trying to lose weight currently. I suggested Buspar since the side effects listed do not include weight gain. She would like to go with Buspar.

## 2019-06-10 NOTE — Addendum Note (Signed)
Addended by: Orland Mustard on: 06/10/2019 03:18 PM   Modules accepted: Orders

## 2019-07-02 ENCOUNTER — Ambulatory Visit (INDEPENDENT_AMBULATORY_CARE_PROVIDER_SITE_OTHER): Payer: 59 | Admitting: Family Medicine

## 2019-07-02 ENCOUNTER — Encounter (INDEPENDENT_AMBULATORY_CARE_PROVIDER_SITE_OTHER): Payer: Self-pay | Admitting: Family Medicine

## 2019-07-02 ENCOUNTER — Other Ambulatory Visit: Payer: Self-pay

## 2019-07-02 VITALS — BP 185/84 | HR 94 | Temp 98.2°F | Ht 61.0 in | Wt 173.0 lb

## 2019-07-02 DIAGNOSIS — R0602 Shortness of breath: Secondary | ICD-10-CM | POA: Diagnosis not present

## 2019-07-02 DIAGNOSIS — E669 Obesity, unspecified: Secondary | ICD-10-CM

## 2019-07-02 DIAGNOSIS — R5383 Other fatigue: Secondary | ICD-10-CM

## 2019-07-02 DIAGNOSIS — R030 Elevated blood-pressure reading, without diagnosis of hypertension: Secondary | ICD-10-CM | POA: Diagnosis not present

## 2019-07-02 DIAGNOSIS — R739 Hyperglycemia, unspecified: Secondary | ICD-10-CM

## 2019-07-02 DIAGNOSIS — Z9189 Other specified personal risk factors, not elsewhere classified: Secondary | ICD-10-CM

## 2019-07-02 DIAGNOSIS — E7849 Other hyperlipidemia: Secondary | ICD-10-CM | POA: Diagnosis not present

## 2019-07-02 DIAGNOSIS — Z0289 Encounter for other administrative examinations: Secondary | ICD-10-CM

## 2019-07-02 DIAGNOSIS — Z6832 Body mass index (BMI) 32.0-32.9, adult: Secondary | ICD-10-CM

## 2019-07-02 DIAGNOSIS — Z1331 Encounter for screening for depression: Secondary | ICD-10-CM | POA: Diagnosis not present

## 2019-07-02 DIAGNOSIS — D509 Iron deficiency anemia, unspecified: Secondary | ICD-10-CM

## 2019-07-02 NOTE — Progress Notes (Signed)
Chief Complaint:   OBESITY Melanie Washington (MR# [id number]) is a 54 y.o. female who presents for evaluation and treatment of obesity and related comorbidities. Current BMI is Body mass index is 32.69 kg/m. Melanie Washington has been struggling with her weight for many years and has been unsuccessful in either losing weight, maintaining weight loss, or reaching her healthy weight goal.  Melanie Washington is currently in the action stage of change and ready to dedicate time achieving and maintaining a healthier weight. Melanie Washington is interested in becoming our patient and working on intensive lifestyle modifications including (but not limited to) diet and exercise for weight loss.  Melanie Washington's habits were reviewed today and are as follows: Her family eats meals together, she thinks her family will eat healthier with her, her desired weight loss is 33-43 lbs, she started gaining weight when she started taken antidepressants, her heaviest weight ever was 190 pounds, she is a picky eater and doesn't like to eat healthier foods, she frequently makes poor food choices, she frequently eats larger portions than normal and she struggles with emotional eating.  Depression Screen Melanie Washington's Food and Mood (modified PHQ-9) score was 13.  Depression screen PHQ 2/9 07/02/2019  Decreased Interest 2  Down, Depressed, Hopeless 3  PHQ - 2 Score 5  Altered sleeping 1  Tired, decreased energy 2  Change in appetite 1  Feeling bad or failure about yourself  0  Trouble concentrating 3  Moving slowly or fidgety/restless 1  Suicidal thoughts 0  PHQ-9 Score 13  Difficult doing work/chores Not difficult at all   Subjective:   1. Other fatigue Melanie Washington admits to daytime somnolence and admits to waking up still tired. Patent has a history of symptoms of daytime fatigue. Melanie Washington generally gets 6 or 7 hours of sleep per night, and states that she has nightime awakenings. Snoring is present. Apneic episodes are not present. Epworth Sleepiness Score is  11.  2. SOB (shortness of breath) on exertion Melanie Washington notes increasing shortness of breath with exercising and seems to be worsening over time with weight gain. She notes getting out of breath sooner with activity than she used to. This has not gotten worse recently. Melanie Washington denies shortness of breath at rest or orthopnea.  3. Elevated blood-pressure reading without diagnosis of hypertension Melanie Washington's blood pressure is elevated today. She denies a history of hypertension. She is a bit nervous about this visit which may be increasing her blood pressure.  4. Other hyperlipidemia Melanie Washington is not on statin, and she would like to improve with diet and exercise.  5. Microcytic anemia Melanie Washington has a history of low hemoglobin, and she notes fatigue.   6. Hyperglycemia Melanie Washington has a history of some elevated glucose readings.  7. At risk for heart disease Melanie Washington is at a higher than average risk for cardiovascular disease due to elevated blood pressure and cholesterol. Reviewed: no chest pain on exertion, no dyspnea on exertion, and no swelling of ankles.  Assessment/Plan:   1. Other fatigue Melanie Washington does feel that her weight is causing her energy to be lower than it should be. Fatigue may be related to obesity, depression or many other causes. Labs will be ordered, and in the meanwhile, Melanie Washington will focus on self care including making healthy food choices, increasing physical activity and focusing on stress reduction.  - EKG 12-Lead - VITAMIN D 25 Hydroxy (Vit-D Deficiency, Fractures) - Vitamin B12 - Folate - T4, free - T3 - TSH  2. SOB (shortness of  breath) on exertion Melanie Washington does feel that she gets out of breath more easily that she used to when she exercises. Melanie Washington's shortness of breath appears to be obesity related and exercise induced. She has agreed to work on weight loss and gradually increase exercise to treat her exercise induced shortness of breath. Will continue to monitor closely.  - VITAMIN D 25  Hydroxy (Vit-D Deficiency, Fractures) - Vitamin B12 - Folate - T4, free - T3 - TSH  3. Elevated blood-pressure reading without diagnosis of hypertension Melanie Washington will start her diet prescription, and will will check labs today. We will recheck her blood pressure in 2 weeks.  4. Other hyperlipidemia Cardiovascular risk and specific lipid/LDL goals reviewed. We discussed several lifestyle modifications today and Melanie Washington will start her diet, and will continue to work on exercise and weight loss efforts. We will check labs today. Orders and follow up as documented in patient record.   - Lipid Panel With LDL/HDL Ratio - Comprehensive metabolic panel  5. Microcytic anemia We will check labs today. Orders and follow up as documented in patient record.  - Anemia panel - CBC with Differential/Platelet  6. Hyperglycemia Fasting labs will be obtained today and results with be discussed with Melanie Washington in 2 weeks at her follow up visit. In the meanwhile Melanie Washington was started on a lower simple carbohydrate diet and will work on weight loss efforts.  - Hemoglobin A1c - Insulin, random  7. Depression screening Melanie Washington had a positive depression screening. Depression is commonly associated with obesity and often results in emotional eating behaviors. We will monitor this closely and work on CBT to help improve the non-hunger eating patterns. Referral to Psychology may be required if no improvement is seen as she continues in our clinic.  8. At risk for heart disease Melanie Washington was given approximately 15 minutes of coronary artery disease prevention counseling today. She is 54 y.o. female and has risk factors for heart disease including obesity, elevated blood pressure, and cholesterol. We discussed intensive lifestyle modifications today with an emphasis on specific weight loss instructions and strategies.   Repetitive spaced learning was employed today to elicit superior memory formation and behavioral change.  9.  Class 1 obesity with serious comorbidity and body mass index (BMI) of 32.0 to 32.9 in adult, unspecified obesity type Melanie Washington is currently in the action stage of change and her goal is to continue with weight loss efforts. I recommend Melanie Washington begin the structured treatment plan as follows:  She has agreed to the Category 2 Plan.  Exercise goals: No exercise has been prescribed for now, while we concentrate on nutritional changes.   Behavioral modification strategies: increasing lean protein intake, no skipping meals and dealing with family or coworker sabotage.  She was informed of the importance of frequent follow-up visits to maximize her success with intensive lifestyle modifications for her multiple health conditions. She was informed we would discuss her lab results at her next visit unless there is a critical issue that needs to be addressed sooner. Melanie Washington agreed to keep her next visit at the agreed upon time to discuss these results.  Objective:   Blood pressure (!) 185/84, pulse 94, temperature 98.2 F (36.8 C), temperature source Oral, height 5\' 1"  (1.549 m), weight 173 lb (78.5 kg), last menstrual period 02/24/2019, SpO2 100 %. Body mass index is 32.69 kg/m.  EKG: Normal sinus rhythm, tachycardia, rate 101 BPM.  Indirect Calorimeter completed today shows a VO2 of 214 and a REE of 1491.  Her calculated basal metabolic rate is 6948 thus her basal metabolic rate is better than expected.  General: Cooperative, alert, well developed, in no acute distress. HEENT: Conjunctivae and lids unremarkable. Cardiovascular: Regular rhythm.  Lungs: Normal work of breathing. Neurologic: No focal deficits.   Lab Results  Component Value Date   CREATININE 0.77 02/07/2018   BUN 13 02/07/2018   NA 140 02/07/2018   K 4.3 02/07/2018   CL 103 02/07/2018   CO2 28 02/07/2018   Lab Results  Component Value Date   ALT 19 02/07/2018   AST 15 02/07/2018   ALKPHOS 112 02/07/2018   BILITOT 0.3  02/07/2018   No results found for: HGBA1C No results found for: INSULIN No results found for: TSH Lab Results  Component Value Date   CHOL 179 02/07/2018   HDL 38.50 (L) 02/07/2018   LDLCALC 123 (H) 02/07/2018   TRIG 91.0 02/07/2018   CHOLHDL 5 02/07/2018   Lab Results  Component Value Date   WBC 7.5 02/07/2018   HGB 10.7 (L) 02/07/2018   HCT 33.6 (L) 02/07/2018   MCV 77.9 (L) 02/07/2018   PLT 365.0 02/07/2018   Lab Results  Component Value Date   IRON 23 (L) 02/07/2018   TIBC 389 02/07/2018   FERRITIN 15 (L) 02/07/2018   Attestation Statements:   Reviewed by clinician on day of visit: allergies, medications, problem list, medical history, surgical history, family history, social history, and previous encounter notes.   I, Burt Knack, am acting as transcriptionist for Quillian Quince, MD.  I have reviewed the above documentation for accuracy and completeness, and I agree with the above. - Quillian Quince, MD
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
## 2019-07-03 LAB — CBC WITH DIFFERENTIAL/PLATELET
Basophils Absolute: 0.1 10*3/uL (ref 0.0–0.2)
Basos: 1 %
EOS (ABSOLUTE): 0.1 10*3/uL (ref 0.0–0.4)
Eos: 2 %
Hemoglobin: 13.4 g/dL (ref 11.1–15.9)
Immature Grans (Abs): 0 10*3/uL (ref 0.0–0.1)
Immature Granulocytes: 0 %
Lymphocytes Absolute: 2.3 10*3/uL (ref 0.7–3.1)
Lymphs: 33 %
MCH: 27.7 pg (ref 26.6–33.0)
MCHC: 33.1 g/dL (ref 31.5–35.7)
MCV: 84 fL (ref 79–97)
Monocytes Absolute: 0.4 10*3/uL (ref 0.1–0.9)
Monocytes: 6 %
Neutrophils Absolute: 4 10*3/uL (ref 1.4–7.0)
Neutrophils: 58 %
Platelets: 386 10*3/uL (ref 150–450)
RBC: 4.83 x10E6/uL (ref 3.77–5.28)
RDW: 15.3 % (ref 11.7–15.4)
WBC: 6.9 10*3/uL (ref 3.4–10.8)

## 2019-07-03 LAB — ANEMIA PANEL
Ferritin: 15 ng/mL (ref 15–150)
Folate, Hemolysate: 570 ng/mL
Folate, RBC: 1407 ng/mL (ref >498)
Hematocrit: 40.5 % (ref 34.0–46.6)
Iron Saturation: 15 % (ref 15–55)
Iron: 59 ug/dL (ref 27–159)
Retic Ct Pct: 0.9 % (ref 0.6–2.6)
Total Iron Binding Capacity: 396 ug/dL (ref 250–450)
UIBC: 337 ug/dL (ref 131–425)
Vitamin B-12: 937 pg/mL (ref 232–1245)

## 2019-07-03 LAB — LIPID PANEL WITH LDL/HDL RATIO
Cholesterol, Total: 220 mg/dL — ABNORMAL HIGH (ref 100–199)
HDL: 44 mg/dL (ref >39)
LDL Chol Calc (NIH): 151 mg/dL — ABNORMAL HIGH (ref 0–99)
LDL/HDL Ratio: 3.4 ratio — ABNORMAL HIGH (ref 0.0–3.2)
Triglycerides: 140 mg/dL (ref 0–149)
VLDL Cholesterol Cal: 25 mg/dL (ref 5–40)

## 2019-07-03 LAB — HEMOGLOBIN A1C
Est. average glucose Bld gHb Est-mCnc: 117 mg/dL
Hgb A1c MFr Bld: 5.7 % — ABNORMAL HIGH (ref 4.8–5.6)

## 2019-07-03 LAB — T4, FREE: Free T4: 1.06 ng/dL (ref 0.82–1.77)

## 2019-07-03 LAB — VITAMIN D 25 HYDROXY (VIT D DEFICIENCY, FRACTURES): Vit D, 25-Hydroxy: 49.7 ng/mL (ref 30.0–100.0)

## 2019-07-03 LAB — COMPREHENSIVE METABOLIC PANEL
ALT: 20 IU/L (ref 0–32)
AST: 19 IU/L (ref 0–40)
Albumin/Globulin Ratio: 1.3 (ref 1.2–2.2)
Albumin: 4.4 g/dL (ref 3.8–4.9)
Alkaline Phosphatase: 142 IU/L — ABNORMAL HIGH (ref 39–117)
BUN/Creatinine Ratio: 16 (ref 9–23)
BUN: 11 mg/dL (ref 6–24)
Bilirubin Total: 0.3 mg/dL (ref 0.0–1.2)
CO2: 22 mmol/L (ref 20–29)
Calcium: 9.2 mg/dL (ref 8.7–10.2)
Chloride: 104 mmol/L (ref 96–106)
Creatinine, Ser: 0.68 mg/dL (ref 0.57–1.00)
GFR calc Af Amer: 117 mL/min/{1.73_m2} (ref >59)
GFR calc non Af Amer: 102 mL/min/{1.73_m2} (ref >59)
Globulin, Total: 3.5 g/dL (ref 1.5–4.5)
Glucose: 88 mg/dL (ref 65–99)
Potassium: 3.9 mmol/L (ref 3.5–5.2)
Sodium: 141 mmol/L (ref 134–144)
Total Protein: 7.9 g/dL (ref 6.0–8.5)

## 2019-07-03 LAB — FOLATE: Folate: >20 ng/mL (ref >3.0)

## 2019-07-03 LAB — T3: T3, Total: 124 ng/dL (ref 71–180)

## 2019-07-03 LAB — TSH: TSH: 2.98 u[IU]/mL (ref 0.450–4.500)

## 2019-07-03 LAB — INSULIN, RANDOM: INSULIN: 14 u[IU]/mL (ref 2.6–24.9)

## 2019-07-04 ENCOUNTER — Ambulatory Visit (INDEPENDENT_AMBULATORY_CARE_PROVIDER_SITE_OTHER): Payer: 59 | Admitting: Psychology

## 2019-07-04 ENCOUNTER — Other Ambulatory Visit: Payer: Self-pay

## 2019-07-04 DIAGNOSIS — F3289 Other specified depressive episodes: Secondary | ICD-10-CM

## 2019-07-11 ENCOUNTER — Other Ambulatory Visit: Payer: Self-pay | Admitting: Family Medicine

## 2019-07-16 ENCOUNTER — Ambulatory Visit (INDEPENDENT_AMBULATORY_CARE_PROVIDER_SITE_OTHER): Payer: 59 | Admitting: Family Medicine

## 2019-07-16 ENCOUNTER — Encounter (INDEPENDENT_AMBULATORY_CARE_PROVIDER_SITE_OTHER): Payer: Self-pay | Admitting: Family Medicine

## 2019-07-16 ENCOUNTER — Other Ambulatory Visit: Payer: Self-pay

## 2019-07-16 VITALS — BP 151/81 | HR 92 | Temp 98.5°F | Ht 61.0 in | Wt 168.0 lb

## 2019-07-16 DIAGNOSIS — Z6831 Body mass index (BMI) 31.0-31.9, adult: Secondary | ICD-10-CM | POA: Diagnosis not present

## 2019-07-16 DIAGNOSIS — R7303 Prediabetes: Secondary | ICD-10-CM

## 2019-07-16 DIAGNOSIS — E669 Obesity, unspecified: Secondary | ICD-10-CM | POA: Diagnosis not present

## 2019-07-16 DIAGNOSIS — E7849 Other hyperlipidemia: Secondary | ICD-10-CM | POA: Diagnosis not present

## 2019-07-16 NOTE — Progress Notes (Signed)
Chief Complaint:   OBESITY Melanie Washington is here to discuss her progress with her obesity treatment plan along with follow-up of her obesity related diagnoses. Melanie Washington is on the Category 2 Plan and states she is following her eating plan approximately 98% of the time. Melanie Washington states she is doing 0 minutes 0 times per week.  Today's visit was #: 2 Starting weight: 173 lbs Starting date: 07/02/2019 Today's weight: 168 lbs Today's date: 07/16/2019 Total lbs lost to date: 5 Total lbs lost since last in-office visit: 5  Interim History: Melanie Washington has been able to have her 2 eggs for breakfast and Lean cuisine for lunch. She has been using her bread from breakfast as afternoon snacks. She reports difficulty consuming all of the meat ounces for dinner plan. We discussed protein equivalents that could be used as substitutions to achieve all of her protein goals for dinner per day.  Subjective:   1. Other hyperlipidemia Melanie Washington's levels are worsening. She had a lipid panel done on 07/02/2019, and total cholesterol is 220, LDL of 151, HDL of 44, and triglycerides 140. She is not currently on statin therapy. I discussed labs with the patient today.  2. Pre-diabetes Melanie Washington's labs done on 07/02/2019 demonstrated A1c of 5.7 and insulin resistance. I encouraged adequate protein intake.  Assessment/Plan:   1. Other hyperlipidemia Cardiovascular risk and specific lipid/LDL goals reviewed. We discussed several lifestyle modifications today and Melanie Washington will continue her Category 2 meal plan, and will continue to work on exercise and weight loss efforts. We will recheck labs in 3 months. Orders and follow up as documented in patient record.   2. Pre-diabetes Melanie Washington will continue her Category 2 meal plan, and will continue to work on weight loss, exercise, and decreasing simple carbohydrates to help decrease the risk of diabetes. We will recheck labs in 3 months.  3. Class 1 obesity with serious comorbidity and body mass index  (BMI) of 31.0 to 31.9 in adult, unspecified obesity type Melanie Washington is currently in the action stage of change. As such, her goal is to continue with weight loss efforts. She has agreed to the Category 2 Plan.   Behavioral modification strategies: increasing lean protein intake, decreasing simple carbohydrates and meal planning and cooking strategies.  Melanie Washington has agreed to follow-up with our clinic in 2 weeks. She was informed of the importance of frequent follow-up visits to maximize her success with intensive lifestyle modifications for her multiple health conditions.   Objective:   Blood pressure (!) 151/81, pulse 92, temperature 98.5 F (36.9 C), temperature source Oral, height 5\' 1"  (1.549 m), weight 168 lb (76.2 kg), last menstrual period 07/08/2019, SpO2 98 %. Body mass index is 31.74 kg/m.  General: Cooperative, alert, well developed, in no acute distress. HEENT: Conjunctivae and lids unremarkable. Cardiovascular: Regular rhythm.  Lungs: Normal work of breathing. Neurologic: No focal deficits.   Lab Results  Component Value Date   CREATININE 0.68 07/02/2019   BUN 11 07/02/2019   NA 141 07/02/2019   K 3.9 07/02/2019   CL 104 07/02/2019   CO2 22 07/02/2019   Lab Results  Component Value Date   ALT 20 07/02/2019   AST 19 07/02/2019   ALKPHOS 142 (H) 07/02/2019   BILITOT 0.3 07/02/2019   Lab Results  Component Value Date   HGBA1C 5.7 (H) 07/02/2019   Lab Results  Component Value Date   INSULIN 14.0 07/02/2019   Lab Results  Component Value Date   TSH 2.980 07/02/2019  Lab Results  Component Value Date   CHOL 220 (H) 07/02/2019   HDL 44 07/02/2019   LDLCALC 151 (H) 07/02/2019   TRIG 140 07/02/2019   CHOLHDL 5 02/07/2018   Lab Results  Component Value Date   WBC 6.9 07/02/2019   HGB 13.4 07/02/2019   HCT 40.5 07/02/2019   MCV 84 07/02/2019   PLT 386 07/02/2019   Lab Results  Component Value Date   IRON 59 07/02/2019   TIBC 396 07/02/2019   FERRITIN 15  07/02/2019   Attestation Statements:   Reviewed by clinician on day of visit: allergies, medications, problem list, medical history, surgical history, family history, social history, and previous encounter notes.  Time spent on visit including pre-visit chart review and post-visit care and charting was 40 minutes.    I, Burt Knack, am acting as transcriptionist for Quillian Quince, MD.  I have reviewed the above documentation for accuracy and completeness, and I agree with the above. -  Quillian Quince, MD

## 2019-07-24 ENCOUNTER — Encounter: Payer: Self-pay | Admitting: Family Medicine

## 2019-07-24 ENCOUNTER — Other Ambulatory Visit: Payer: Self-pay

## 2019-07-24 ENCOUNTER — Ambulatory Visit (INDEPENDENT_AMBULATORY_CARE_PROVIDER_SITE_OTHER): Payer: 59 | Admitting: Family Medicine

## 2019-07-24 VITALS — BP 150/70 | HR 88 | Temp 98.3°F | Ht 61.0 in | Wt 169.4 lb

## 2019-07-24 DIAGNOSIS — E782 Mixed hyperlipidemia: Secondary | ICD-10-CM | POA: Diagnosis not present

## 2019-07-24 DIAGNOSIS — Z0000 Encounter for general adult medical examination without abnormal findings: Secondary | ICD-10-CM

## 2019-07-24 DIAGNOSIS — F411 Generalized anxiety disorder: Secondary | ICD-10-CM

## 2019-07-24 DIAGNOSIS — R030 Elevated blood-pressure reading, without diagnosis of hypertension: Secondary | ICD-10-CM

## 2019-07-24 DIAGNOSIS — Z1211 Encounter for screening for malignant neoplasm of colon: Secondary | ICD-10-CM | POA: Diagnosis not present

## 2019-07-24 DIAGNOSIS — R7303 Prediabetes: Secondary | ICD-10-CM

## 2019-07-24 NOTE — Patient Instructions (Signed)
Your blood pressure is high. Get an arm cuff and start a log for me. Take your blood pressure after you have been sitting down for about 5-10 minutes. Cut out salt in your diet as well. See you back in 3 months for follow up.   Great to see you! Happy easter Dr .Rogers Blocker    Hypertension, Adult Hypertension is another name for high blood pressure. High blood pressure forces your heart to work harder to pump blood. This can cause problems over time. There are two numbers in a blood pressure reading. There is a top number (systolic) over a bottom number (diastolic). It is best to have a blood pressure that is below 120/80. Healthy choices can help lower your blood pressure, or you may need medicine to help lower it. What are the causes? The cause of this condition is not known. Some conditions may be related to high blood pressure. What increases the risk?  Smoking.  Having type 2 diabetes mellitus, high cholesterol, or both.  Not getting enough exercise or physical activity.  Being overweight.  Having too much fat, sugar, calories, or salt (sodium) in your diet.  Drinking too much alcohol.  Having long-term (chronic) kidney disease.  Having a family history of high blood pressure.  Age. Risk increases with age.  Race. You may be at higher risk if you are African American.  Gender. Men are at higher risk than women before age 62. After age 9, women are at higher risk than men.  Having obstructive sleep apnea.  Stress. What are the signs or symptoms?  High blood pressure may not cause symptoms. Very high blood pressure (hypertensive crisis) may cause: ? Headache. ? Feelings of worry or nervousness (anxiety). ? Shortness of breath. ? Nosebleed. ? A feeling of being sick to your stomach (nausea). ? Throwing up (vomiting). ? Changes in how you see. ? Very bad chest pain. ? Seizures. How is this treated?  This condition is treated by making healthy lifestyle changes, such  as: ? Eating healthy foods. ? Exercising more. ? Drinking less alcohol.  Your health care provider may prescribe medicine if lifestyle changes are not enough to get your blood pressure under control, and if: ? Your top number is above 130. ? Your bottom number is above 80.  Your personal target blood pressure may vary. Follow these instructions at home: Eating and drinking   If told, follow the DASH eating plan. To follow this plan: ? Fill one half of your plate at each meal with fruits and vegetables. ? Fill one fourth of your plate at each meal with whole grains. Whole grains include whole-wheat pasta, brown rice, and whole-grain bread. ? Eat or drink low-fat dairy products, such as skim milk or low-fat yogurt. ? Fill one fourth of your plate at each meal with low-fat (lean) proteins. Low-fat proteins include fish, chicken without skin, eggs, beans, and tofu. ? Avoid fatty meat, cured and processed meat, or chicken with skin. ? Avoid pre-made or processed food.  Eat less than 1,500 mg of salt each day.  Do not drink alcohol if: ? Your doctor tells you not to drink. ? You are pregnant, may be pregnant, or are planning to become pregnant.  If you drink alcohol: ? Limit how much you use to:  0-1 drink a day for women.  0-2 drinks a day for men. ? Be aware of how much alcohol is in your drink. In the [place] one drink equals one 12 oz  bottle of beer (355 mL), one 5 oz glass of wine (148 mL), or one 1 oz glass of hard liquor (44 mL). Lifestyle   Work with your doctor to stay at a healthy weight or to lose weight. Ask your doctor what the best weight is for you.  Get at least 30 minutes of exercise most days of the week. This may include walking, swimming, or biking.  Get at least 30 minutes of exercise that strengthens your muscles (resistance exercise) at least 3 days a week. This may include lifting weights or doing Pilates.  Do not use any products that contain nicotine or  tobacco, such as cigarettes, e-cigarettes, and chewing tobacco. If you need help quitting, ask your doctor.  Check your blood pressure at home as told by your doctor.  Keep all follow-up visits as told by your doctor. This is important. Medicines  Take over-the-counter and prescription medicines only as told by your doctor. Follow directions carefully.  Do not skip doses of blood pressure medicine. The medicine does not work as well if you skip doses. Skipping doses also puts you at risk for problems.  Ask your doctor about side effects or reactions to medicines that you should watch for. Contact a doctor if you:  Think you are having a reaction to the medicine you are taking.  Have headaches that keep coming back (recurring).  Feel dizzy.  Have swelling in your ankles.  Have trouble with your vision. Get help right away if you:  Get a very bad headache.  Start to feel mixed up (confused).  Feel weak or numb.  Feel faint.  Have very bad pain in your: ? Chest. ? Belly (abdomen).  Throw up more than once.  Have trouble breathing. Summary  Hypertension is another name for high blood pressure.  High blood pressure forces your heart to work harder to pump blood.  For most people, a normal blood pressure is less than 120/80.  Making healthy choices can help lower blood pressure. If your blood pressure does not get lower with healthy choices, you may need to take medicine. This information is not intended to replace advice given to you by your health care provider. Make sure you discuss any questions you have with your health care provider. Document Revised: 12/20/2017 Document Reviewed: 12/20/2017 Elsevier Patient Education  2020 [employer]   Preventive Care 21-63 Years Old, Female Preventive care refers to visits with your health care provider and lifestyle choices that can promote health and wellness. This includes:  A yearly physical exam. This may also be  called an annual well check.  Regular dental visits and eye exams.  Immunizations.  Screening for certain conditions.  Healthy lifestyle choices, such as eating a healthy diet, getting regular exercise, not using drugs or products that contain nicotine and tobacco, and limiting alcohol use. What can I expect for my preventive care visit? Physical exam Your health care provider will check your:  Height and weight. This may be used to calculate body mass index (BMI), which tells if you are at a healthy weight.  Heart rate and blood pressure.  Skin for abnormal spots. Counseling Your health care provider may ask you questions about your:  Alcohol, tobacco, and drug use.  Emotional well-being.  Home and relationship well-being.  Sexual activity.  Eating habits.  Work and work Statistician.  Method of birth control.  Menstrual cycle.  Pregnancy history. What immunizations do I need?  Influenza (flu) vaccine  This is  recommended every year. Tetanus, diphtheria, and pertussis (Tdap) vaccine  You may need a Td booster every 10 years. Varicella (chickenpox) vaccine  You may need this if you have not been vaccinated. Zoster (shingles) vaccine  You may need this after age 80. Measles, mumps, and rubella (MMR) vaccine  You may need at least one dose of MMR if you were born in 1957 or later. You may also need a second dose. Pneumococcal conjugate (PCV13) vaccine  You may need this if you have certain conditions and were not previously vaccinated. Pneumococcal polysaccharide (PPSV23) vaccine  You may need one or two doses if you smoke cigarettes or if you have certain conditions. Meningococcal conjugate (MenACWY) vaccine  You may need this if you have certain conditions. Hepatitis A vaccine  You may need this if you have certain conditions or if you travel or work in places where you may be exposed to hepatitis A. Hepatitis B vaccine  You may need this if you  have certain conditions or if you travel or work in places where you may be exposed to hepatitis B. Haemophilus influenzae type b (Hib) vaccine  You may need this if you have certain conditions. Human papillomavirus (HPV) vaccine  If recommended by your health care provider, you may need three doses over 6 months. You may receive vaccines as individual doses or as more than one vaccine together in one shot (combination vaccines). Talk with your health care provider about the risks and benefits of combination vaccines. What tests do I need? Blood tests  Lipid and cholesterol levels. These may be checked every 5 years, or more frequently if you are over 17 years old.  Hepatitis C test.  Hepatitis B test. Screening  Lung cancer screening. You may have this screening every year starting at age 8 if you have a 30-pack-year history of smoking and currently smoke or have quit within the past 15 years.  Colorectal cancer screening. All adults should have this screening starting at age 42 and continuing until age 70. Your health care provider may recommend screening at age 15 if you are at increased risk. You will have tests every 1-10 years, depending on your results and the type of screening test.  Diabetes screening. This is done by checking your blood sugar (glucose) after you have not eaten for a while (fasting). You may have this done every 1-3 years.  Mammogram. This may be done every 1-2 years. Talk with your health care provider about when you should start having regular mammograms. This may depend on whether you have a family history of breast cancer.  BRCA-related cancer screening. This may be done if you have a family history of breast, ovarian, tubal, or peritoneal cancers.  Pelvic exam and Pap test. This may be done every 3 years starting at age 34. Starting at age 47, this may be done every 5 years if you have a Pap test in combination with an HPV test. Other tests  Sexually  transmitted disease (STD) testing.  Bone density scan. This is done to screen for osteoporosis. You may have this scan if you are at high risk for osteoporosis. Follow these instructions at home: Eating and drinking  Eat a diet that includes fresh fruits and vegetables, whole grains, lean protein, and low-fat dairy.  Take vitamin and mineral supplements as recommended by your health care provider.  Do not drink alcohol if: ? Your health care provider tells you not to drink. ? You are pregnant, may  be pregnant, or are planning to become pregnant.  If you drink alcohol: ? Limit how much you have to 0-1 drink a day. ? Be aware of how much alcohol is in your drink. In the [place] one drink equals one 12 oz bottle of beer (355 mL), one 5 oz glass of wine (148 mL), or one 1 oz glass of hard liquor (44 mL). Lifestyle  Take daily care of your teeth and gums.  Stay active. Exercise for at least 30 minutes on 5 or more days each week.  Do not use any products that contain nicotine or tobacco, such as cigarettes, e-cigarettes, and chewing tobacco. If you need help quitting, ask your health care provider.  If you are sexually active, practice safe sex. Use a condom or other form of birth control (contraception) in order to prevent pregnancy and STIs (sexually transmitted infections).  If told by your health care provider, take low-dose aspirin daily starting at age 64. What's next?  Visit your health care provider once a year for a well check visit.  Ask your health care provider how often you should have your eyes and teeth checked.  Stay up to date on all vaccines. This information is not intended to replace advice given to you by your health care provider. Make sure you discuss any questions you have with your health care provider. Document Revised: 12/21/2017 Document Reviewed: 12/21/2017 Elsevier Patient Education  2020 [employer].

## 2019-07-24 NOTE — Progress Notes (Signed)
Patient: Melanie Washington MRN: [id number] DOB: [birth year] PCP: Orland Mustard, MD     Subjective:  Chief Complaint  Patient presents with  . Annual Exam    Pt is fasting    HPI: The patient is a 54 y.o. female who presents today for annual exam. She denies any changes to past medical history. There have been no recent hospitalizations. They are following a well balanced diet and exercise plan. Pt is being seen in weight management. Weight has been decreasing steadily. No complaints today.   No hx of colon cancer or breast cancer in first degree relative.   She is [facility]. She has lost 5 pounds. She just started this month.   GAD: she is currently on trintellix 10mg , but insurance will not longer cover this drug. I have changed her to buspar, but she will not be out of trintellix until this weekend. She will start her buspar at that time.   The 10-year ASCVD risk score DC Jr., et al., 2013) is: 2.7%   Immunization History  Administered Date(s) Administered  . Influenza,inj,Quad PF,6+ Mos 02/07/2018  . Tdap 04/27/2007, 11/06/2017   Colonoscopy: never had. Does fecal occult testing. Overdue for this.  Mammogram: due for this.  Pap smear: needs this.    Review of Systems  Constitutional: Negative for chills, fatigue and fever.  HENT: Negative for congestion, dental problem, ear pain, hearing loss, sinus pressure, sinus pain and trouble swallowing.   Eyes: Negative for visual disturbance.  Respiratory: Negative for cough, chest tightness, shortness of breath and wheezing.   Cardiovascular: Negative for chest pain, palpitations and leg swelling.  Gastrointestinal: Negative for abdominal pain, blood in stool, diarrhea, nausea and vomiting.  Endocrine: Negative for cold intolerance, polydipsia, polyphagia and polyuria.  Genitourinary: Negative for dysuria, hematuria, pelvic pain and urgency.  Musculoskeletal:  Negative for arthralgias.  Skin: Negative for rash.  Neurological: Negative for dizziness, light-headedness and headaches.  Psychiatric/Behavioral: Negative for dysphoric mood and sleep disturbance. The patient is not nervous/anxious.     Allergies Patient has No Known Allergies.  Past Medical History Patient  has a past medical history of Anxiety, Back pain, Depression, Fear of other medical care, GERD (gastroesophageal reflux disease), Iron deficiency anemia, Joint pain, and Mixed hyperlipidemia.  Surgical History Patient  has a past surgical history that includes Cesarean section.  Family History Pateint's family history includes Alcohol abuse in her maternal grandfather, paternal grandfather, and paternal grandmother; Asthma in her brother; COPD in her maternal grandmother and paternal grandmother; Cancer in her father and maternal grandfather; Depression in her brother; Diabetes in her father; Early death in her father and maternal grandfather; Hyperlipidemia in her father, maternal grandfather, and mother; Hypertension in her father and mother; Kidney disease in her father; Stroke in her father.  Social History Patient  reports that she has never smoked. She has never used smokeless tobacco. She reports previous alcohol use. She reports previous drug use.    Objective: Vitals:   07/24/19 0944  BP: (!) 150/70  Pulse: 88  Temp: 98.3 F (36.8 C)  TempSrc: Temporal  SpO2: 94%  Weight: 169 lb 6.4 oz (76.8 kg)  Height: 5\' 1"  (1.549 m)    Body mass index is 32.01 kg/m.  Physical Exam Vitals reviewed.  Constitutional:      Appearance: Normal appearance. She is well-developed. She is obese.  HENT:     Head: Normocephalic and atraumatic.  Right Ear: Tympanic membrane, ear canal and external ear normal.     Left Ear: Tympanic membrane, ear canal and external ear normal.     Mouth/Throat:     Mouth: Mucous membranes are moist.  Eyes:     Extraocular Movements: Extraocular  movements intact.     Conjunctiva/sclera: Conjunctivae normal.     Pupils: Pupils are equal, round, and reactive to light.  Neck:     Thyroid: No thyromegaly.  Cardiovascular:     Rate and Rhythm: Normal rate and regular rhythm.     Heart sounds: Normal heart sounds. No murmur.  Pulmonary:     Effort: Pulmonary effort is normal.     Breath sounds: Normal breath sounds.  Abdominal:     General: Bowel sounds are normal. There is no distension.     Palpations: Abdomen is soft.     Tenderness: There is no abdominal tenderness.  Musculoskeletal:     Cervical back: Normal range of motion and neck supple.  Lymphadenopathy:     Cervical: No cervical adenopathy.  Skin:    General: Skin is warm and dry.     Capillary Refill: Capillary refill takes less than 2 seconds.     Findings: No rash.  Neurological:     General: No focal deficit present.     Mental Status: She is alert and oriented to person, place, and time.     Cranial Nerves: No cranial nerve deficit.     Coordination: Coordination normal.     Deep Tendon Reflexes: Reflexes normal.  Psychiatric:        Mood and Affect: Mood normal.        Behavior: Behavior normal.    GAD 7 : Generalized Anxiety Score 07/24/2019 05/10/2019 02/07/2018  Nervous, Anxious, on Edge 1 1 1   Control/stop worrying 3 3 2   Worry too much - different things 2 3 3   Trouble relaxing 1 1 2   Restless 0 0 1  Easily annoyed or irritable 1 0 1  Afraid - awful might happen 0 1 1  Total GAD 7 Score 8 9 11   Anxiety Difficulty Not difficult at all Not difficult at all Not difficult at all           Office Visit from 07/24/2019 in [place]  PHQ-2 Total Score  0       Assessment/plan: 1. Annual physical exam All labs just done at her weight loss appointment. Reviewed today. HM discussed. She never went and picked up her stool occult card so will need to do at follow up. Discussed she is overdue for mmg and pap smear and will try  to do pap smear at her f/u in 3 months. Doing great weight loss and proud of her. Cholesterol is elevated but her ascvd risk is 2.7%. she is really working on weight loss. All other labs wnl. F/u in 3 months for pap smear.  Patient counseling [x]    Nutrition: Stressed importance of moderation in sodium/caffeine intake, saturated fat and cholesterol, caloric balance, sufficient intake of fresh fruits, vegetables, fiber, calcium, iron, and 1 mg of folate supplement per day (for females capable of pregnancy).  [x]    Stressed the importance of regular exercise.   []    Substance Abuse: Discussed cessation/primary prevention of tobacco, alcohol, or other drug use; driving or other dangerous activities under the influence; availability of treatment for abuse.   [x]    Injury prevention: Discussed safety belts, safety helmets, smoke detector, smoking near  bedding or upholstery.   [x]    Sexuality: Discussed sexually transmitted diseases, partner selection, use of condoms, avoidance of unintended pregnancy  and contraceptive alternatives.  [x]    Dental health: Discussed importance of regular tooth brushing, flossing, and dental visits.  [x]    Health maintenance and immunizations reviewed. Please refer to Health maintenance section.     2. Colon cancer screening  - Fecal occult blood, imunochemical; Future  3. GAD (generalized anxiety disorder) GAD7 shows her to be very well controlled, but she is about to change from trintellix. We will have her come back in 3 months to make sure she is doing okay on new medication. Call sooner if any issues. Already has instructions on starting new medication when out of trintellix.   4. Prediabetes Just diagnosed at weight loss clinic. Already losing weight. Continue to monitor.   5. Mixed hyperlipidemia The 10-year ASCVD risk score DC Jr., et al., 2013) is: 2.7% No indication for medication at this time. Working on lifestyle changes with weight loss clinic.    6. Elevated blood pressure reading Multiple elevated readings, but has hx of anxiety and fear of doctors. Did not get cuff as I asked last time, but she will get a cuff and start a log for me. Also advised low salt to no salt diet. She is working on weight loss. F/u in 3 months with log and recheck.    This visit occurred during the SARS-CoV-2 public health emergency.  Safety protocols were in place, including screening questions prior to the visit, additional usage of staff PPE, and extensive cleaning of exam room while observing appropriate contact time as indicated for disinfecting solutions.     Return in about 3 months (around 10/23/2019) for blood pressure and anxiety follow up .     Denman George, MD [facility]  07/24/2019

## 2019-07-31 ENCOUNTER — Ambulatory Visit (INDEPENDENT_AMBULATORY_CARE_PROVIDER_SITE_OTHER): Payer: 59 | Admitting: Family Medicine

## 2019-07-31 ENCOUNTER — Encounter (INDEPENDENT_AMBULATORY_CARE_PROVIDER_SITE_OTHER): Payer: Self-pay | Admitting: Family Medicine

## 2019-07-31 ENCOUNTER — Other Ambulatory Visit: Payer: Self-pay

## 2019-07-31 VITALS — BP 160/85 | HR 83 | Temp 98.3°F | Ht 61.0 in | Wt 166.0 lb

## 2019-07-31 DIAGNOSIS — F3289 Other specified depressive episodes: Secondary | ICD-10-CM

## 2019-07-31 DIAGNOSIS — R030 Elevated blood-pressure reading, without diagnosis of hypertension: Secondary | ICD-10-CM

## 2019-07-31 DIAGNOSIS — E669 Obesity, unspecified: Secondary | ICD-10-CM

## 2019-07-31 DIAGNOSIS — Z6831 Body mass index (BMI) 31.0-31.9, adult: Secondary | ICD-10-CM

## 2019-07-31 DIAGNOSIS — E7800 Pure hypercholesterolemia, unspecified: Secondary | ICD-10-CM | POA: Diagnosis not present

## 2019-07-31 DIAGNOSIS — Z9189 Other specified personal risk factors, not elsewhere classified: Secondary | ICD-10-CM | POA: Diagnosis not present

## 2019-07-31 DIAGNOSIS — R7303 Prediabetes: Secondary | ICD-10-CM | POA: Diagnosis not present

## 2019-07-31 NOTE — Progress Notes (Signed)
Chief Complaint:   OBESITY Melanie Washington is here to discuss her progress with her obesity treatment plan along with follow-up of her obesity related diagnoses. Melanie Washington is on the Category 2 Plan and states she is following her eating plan approximately 80% of the time. [name] states she is exercising for 0 minutes 0 times per week.  Today's visit was #: 3 Starting weight: 173 lbs Starting date: 07/02/2019 Today's weight: 166 lbs Today's date: 07/31/2019 Total lbs lost to date: 7 lbs Total lbs lost since last in-office visit: 2 lbs  Interim History: Melanie Washington provided the following food recall today:  Breakfast:  2 eggs. Lunch:  Melanie Washington. Dinner:  Meat (6 ounces), 2 vegetables. Snack:  Usually using at night. Drinking water 24 ounces, 20 ounce Propel.  No ETOH.  She denies constipation.  Subjective:   1. Prediabetes Melanie Washington has a diagnosis of prediabetes based on her elevated HgA1c and was informed this puts her at greater risk of developing diabetes. She continues to work on diet and exercise to decrease her risk of diabetes. She denies nausea or hypoglycemia.  Lab Results  Component Value Date   HGBA1C 5.7 (H) 07/02/2019   Lab Results  Component Value Date   INSULIN 14.0 07/02/2019   2. Pure hypercholesterolemia Melanie Washington has hyperlipidemia and has been trying to improve her cholesterol levels with intensive lifestyle modification including a low saturated fat diet, exercise and weight loss. She denies any chest pain, claudication or myalgias.  Lab Results  Component Value Date   ALT 20 07/02/2019   AST 19 07/02/2019   ALKPHOS 142 (H) 07/02/2019   BILITOT 0.3 07/02/2019   Lab Results  Component Value Date   CHOL 220 (H) 07/02/2019   HDL 44 07/02/2019   LDLCALC 151 (H) 07/02/2019   TRIG 140 07/02/2019   CHOLHDL 5 02/07/2018   3. Elevated blood pressure reading [name] is having this monitored by her PCP.  No chest pain, shortness of breath, or edema.  She says at home, her  blood pressure ranges 140-145/70s.  BP Readings from Last 3 Encounters:  07/31/19 (!) 160/85  07/24/19 (!) 150/70  07/16/19 (!) 151/81   4. Other depression, with emotional eating Melanie Washington is struggling with emotional eating and using food for comfort to the extent that it is negatively impacting her health. She has been working on behavior modification techniques to help reduce her emotional eating and has been successful. She is taking Wellbutrin 300 mg daily. Unfortunately, she is off Trintellix as of today.  5. At risk for anxiety [name] is at risk of developing anxiety due to stopping Trintellix. She recently started Buspar.   Assessment/Plan:   1. Prediabetes Melanie Washington will continue to work on weight loss, exercise, and decreasing simple carbohydrates to help decrease the risk of diabetes.    2. Pure hypercholesterolemia Cardiovascular risk and specific lipid/LDL goals reviewed.  We discussed several lifestyle modifications today and Melanie Washington will continue to work on diet, exercise and weight loss efforts. Orders and follow up as documented in patient record.   Counseling Intensive lifestyle modifications are the first line treatment for this issue. . Dietary changes: Increase soluble fiber. Decrease simple carbohydrates. . Exercise changes: Moderate to vigorous-intensity aerobic activity 150 minutes per week if tolerated. . Lipid-lowering medications: see documented in medical record.  3. Elevated blood pressure reading Melanie Washington is working on healthy weight loss and exercise to improve blood pressure control. We will watch for signs of hypotension as she continues her  lifestyle modifications.  4. Other depression, with emotional eating Behavior modification techniques were discussed today to help Irlanda deal with her emotional/non-hunger eating behaviors.  Orders and follow up as documented in patient record.  I recommend Prozac as being (weight neutral).  5. At risk for anxiety Melanie Washington was  given approximately 15 minutes of anxiety risk counseling today. She has risk factors for anxiety including stopping Trintellix. We discussed the importance of a healthy work life balance, a healthy relationship with food and a good support system.  Repetitive spaced learning was employed today to elicit superior memory formation and behavioral change.  6. Class 1 obesity with serious comorbidity and body mass index (BMI) of 31.0 to 31.9 in adult, unspecified obesity type Melanie Washington is currently in the action stage of change. As such, her goal is to continue with weight loss efforts. She has agreed to the Category 2 Plan.   Exercise goals: Walk (with grandchild in stroller) 2-3 days a week for 30 minutes.  Behavioral modification strategies: increasing water intake.  Melanie Washington has agreed to follow-up with our clinic in 2 weeks. She was informed of the importance of frequent follow-up visits to maximize her success with intensive lifestyle modifications for her multiple health conditions.   Objective:   Blood pressure (!) 160/85, pulse 83, temperature 98.3 F (36.8 C), temperature source Oral, height 5\' 1"  (1.549 m), weight 166 lb (75.3 kg), last menstrual period 07/08/2019, SpO2 98 %. Body mass index is 31.37 kg/m.  General: Cooperative, alert, well developed, in no acute distress. HEENT: Conjunctivae and lids unremarkable. Cardiovascular: Regular rhythm.  Lungs: Normal work of breathing. Neurologic: No focal deficits.   Lab Results  Component Value Date   CREATININE 0.68 07/02/2019   BUN 11 07/02/2019   NA 141 07/02/2019   K 3.9 07/02/2019   CL 104 07/02/2019   CO2 22 07/02/2019   Lab Results  Component Value Date   ALT 20 07/02/2019   AST 19 07/02/2019   ALKPHOS 142 (H) 07/02/2019   BILITOT 0.3 07/02/2019   Lab Results  Component Value Date   HGBA1C 5.7 (H) 07/02/2019   Lab Results  Component Value Date   INSULIN 14.0 07/02/2019   Lab Results  Component Value Date   TSH  2.980 07/02/2019   Lab Results  Component Value Date   CHOL 220 (H) 07/02/2019   HDL 44 07/02/2019   LDLCALC 151 (H) 07/02/2019   TRIG 140 07/02/2019   CHOLHDL 5 02/07/2018   Lab Results  Component Value Date   WBC 6.9 07/02/2019   HGB 13.4 07/02/2019   HCT 40.5 07/02/2019   MCV 84 07/02/2019   PLT 386 07/02/2019   Lab Results  Component Value Date   IRON 59 07/02/2019   TIBC 396 07/02/2019   FERRITIN 15 07/02/2019   Attestation Statements:   Reviewed by clinician on day of visit: allergies, medications, problem list, medical history, surgical history, family history, social history, and previous encounter notes.  I, Water quality scientist, CMA, am acting as Location manager for [employer], DO.  I have reviewed the above documentation for accuracy and completeness, and I agree with the above. Briscoe Deutscher, DO

## 2019-08-01 ENCOUNTER — Encounter: Payer: Self-pay | Admitting: Family Medicine

## 2019-08-02 NOTE — Telephone Encounter (Signed)
Pt will like to change Depression Medication Buspar to Prozac Stated is having side effect, unable to sleep, hot flashes. Please advise

## 2019-08-02 NOTE — Telephone Encounter (Signed)
Please see message below. Thank You.

## 2019-08-02 NOTE — Telephone Encounter (Signed)
LVM to pt to return call.

## 2019-08-08 ENCOUNTER — Telehealth: Payer: 59 | Admitting: Family Medicine

## 2019-08-26 ENCOUNTER — Ambulatory Visit (INDEPENDENT_AMBULATORY_CARE_PROVIDER_SITE_OTHER): Payer: 59 | Admitting: Family Medicine

## 2019-08-26 ENCOUNTER — Encounter (INDEPENDENT_AMBULATORY_CARE_PROVIDER_SITE_OTHER): Payer: Self-pay | Admitting: Family Medicine

## 2019-08-26 ENCOUNTER — Other Ambulatory Visit: Payer: Self-pay

## 2019-08-26 VITALS — BP 150/78 | HR 77 | Temp 98.4°F | Ht 61.0 in | Wt 163.0 lb

## 2019-08-26 DIAGNOSIS — E669 Obesity, unspecified: Secondary | ICD-10-CM

## 2019-08-26 DIAGNOSIS — F419 Anxiety disorder, unspecified: Secondary | ICD-10-CM

## 2019-08-26 DIAGNOSIS — Z9189 Other specified personal risk factors, not elsewhere classified: Secondary | ICD-10-CM | POA: Diagnosis not present

## 2019-08-26 DIAGNOSIS — R030 Elevated blood-pressure reading, without diagnosis of hypertension: Secondary | ICD-10-CM

## 2019-08-26 DIAGNOSIS — F329 Major depressive disorder, single episode, unspecified: Secondary | ICD-10-CM

## 2019-08-26 DIAGNOSIS — Z6830 Body mass index (BMI) 30.0-30.9, adult: Secondary | ICD-10-CM

## 2019-08-26 DIAGNOSIS — E7849 Other hyperlipidemia: Secondary | ICD-10-CM

## 2019-08-26 NOTE — Progress Notes (Signed)
Chief Complaint:   OBESITY Melanie Washington is here to discuss her progress with her obesity treatment plan along with follow-up of her obesity related diagnoses. Melanie Washington is on the Category 2 Plan and states Melanie Washington is following her eating plan approximately 75% of the time. Melanie Washington states Melanie Washington is walking for 35 minutes 2 times per week.  Today's visit was #: 4 Starting weight: 173 lbs Starting date: 07/02/2019 Today's weight: 163 lbs Today's date: 08/26/2019 Total lbs lost to date: 10 lbs Total lbs lost since last in-office visit: 3 lbs  Interim History: Melanie Washington says Melanie Washington has been sleeping well now.  Melanie Washington feels that healthy meals and exercise help a lot.  Melanie Washington is down 10 pounds today.  Melanie Washington is still happy with the meal plan but would like to incorprate cereal for breakfast.  We discussed protein cereal with Fairlife milk.  Subjective:   1. Elevated blood pressure reading Blood pressure 150/78, pulse 77 in office.  No headache, chest pain, edema.    BP Readings from Last 3 Encounters:  08/26/19 (!) 150/78  07/31/19 (!) 160/85  07/24/19 (!) 150/70   2. Other hyperlipidemia Melanie Washington has hyperlipidemia and has been trying to improve her cholesterol levels with intensive lifestyle modification including a low saturated fat diet, exercise and weight loss. Melanie Washington denies any chest pain, claudication or myalgias.    The 10-year ASCVD risk score Denman George DC Montez Hageman., et al., 2013) is: 2.7%   Values used to calculate the score:     Age: 54 years     Sex: Female     Is Non-Hispanic African American: No     Diabetic: No     Tobacco smoker: No     Systolic Blood Pressure: 150 mmHg     Is BP treated: No     HDL Cholesterol: 44 mg/dL     Total Cholesterol: 220 mg/dL  Lab Results  Component Value Date   ALT 20 07/02/2019   AST 19 07/02/2019   ALKPHOS 142 (H) 07/02/2019   BILITOT 0.3 07/02/2019   Lab Results  Component Value Date   CHOL 220 (H) 07/02/2019   HDL 44 07/02/2019   LDLCALC 151 (H) 07/02/2019   TRIG 140  07/02/2019   CHOLHDL 5 02/07/2018   3. Anxiety and depression, with emotional eating Shanikia is taking Wellbutrin and Buspar.  Melanie Washington says this is working well now for her anxiety.  Assessment/Plan:   1. Elevated blood pressure reading This problem is unchanged. Followed by Dr. Artis Flock for this problem. Those encounter notes were reviewed. Counseling: Intensive lifestyle modifications are the first line treatment for this issue. We discussed several lifestyle modifications today and Melanie Washington will continue to work on diet, exercise and weight loss efforts. Recommended sodium restriction.  2. Other hyperlipidemia Cardiovascular risk and specific lipid/LDL goals reviewed.  We discussed several lifestyle modifications today and Graciemae will continue to work on diet, exercise and weight loss efforts. Orders and follow up as documented in patient record.  Melanie Washington is due for a lab recheck in 2 months.  Counseling Intensive lifestyle modifications are the first line treatment for this issue. . Dietary changes: Increase soluble fiber. Decrease simple carbohydrates. . Exercise changes: Moderate to vigorous-intensity aerobic activity 150 minutes per week if tolerated. . Lipid-lowering medications: see documented in medical record.  3. Anxiety and depression, with emotional eating Current treatment is working well for her anxiety.  Behavior modification techniques were discussed today to help Melanie Washington deal with her emotional/non-hunger eating behaviors.  Orders and follow up as documented in patient record.   4. At risk for heart disease Carrolyn was given approximately 15 minutes of coronary artery disease prevention counseling today. Melanie Washington is 54 y.o. female and has risk factors for heart disease including obesity. We discussed intensive lifestyle modifications today with an emphasis on specific weight loss instructions and strategies.   Repetitive spaced learning was employed today to elicit superior memory formation and  behavioral change.  5. Class 1 obesity with serious comorbidity and body mass index (BMI) of 30.0 to 30.9 in adult, unspecified obesity type Melanie Washington is currently in the action stage of change. As such, her goal is to continue with weight loss efforts. Melanie Washington has agreed to the Category 2 Plan with 250-350 calories and 20+ grams of protein at breakfast.   Exercise goals: For substantial health benefits, adults should do at least 150 minutes (2 hours and 30 minutes) a week of moderate-intensity, or 75 minutes (1 hour and 15 minutes) a week of vigorous-intensity aerobic physical activity, or an equivalent combination of moderate- and vigorous-intensity aerobic activity. Aerobic activity should be performed in episodes of at least 10 minutes, and preferably, it should be spread throughout the week.  Behavioral modification strategies: increasing lean protein intake and increasing water intake.  Aleia has agreed to follow-up with our clinic in 2 weeks. Melanie Washington was informed of the importance of frequent follow-up visits to maximize her success with intensive lifestyle modifications for her multiple health conditions.   Objective:   Blood pressure (!) 150/78, pulse 77, temperature 98.4 F (36.9 C), temperature source Oral, height 5\' 1"  (1.549 m), weight 163 lb (73.9 kg), last menstrual period 07/08/2019, SpO2 100 %. Body mass index is 30.8 kg/m.  General: Cooperative, alert, well developed, in no acute distress. HEENT: Conjunctivae and lids unremarkable. Cardiovascular: Regular rhythm.  Lungs: Normal work of breathing. Neurologic: No focal deficits.   Lab Results  Component Value Date   CREATININE 0.68 07/02/2019   BUN 11 07/02/2019   NA 141 07/02/2019   K 3.9 07/02/2019   CL 104 07/02/2019   CO2 22 07/02/2019   Lab Results  Component Value Date   ALT 20 07/02/2019   AST 19 07/02/2019   ALKPHOS 142 (H) 07/02/2019   BILITOT 0.3 07/02/2019   Lab Results  Component Value Date   HGBA1C 5.7 (H)  07/02/2019   Lab Results  Component Value Date   INSULIN 14.0 07/02/2019   Lab Results  Component Value Date   TSH 2.980 07/02/2019   Lab Results  Component Value Date   CHOL 220 (H) 07/02/2019   HDL 44 07/02/2019   LDLCALC 151 (H) 07/02/2019   TRIG 140 07/02/2019   CHOLHDL 5 02/07/2018   Lab Results  Component Value Date   WBC 6.9 07/02/2019   HGB 13.4 07/02/2019   HCT 40.5 07/02/2019   MCV 84 07/02/2019   PLT 386 07/02/2019   Lab Results  Component Value Date   IRON 59 07/02/2019   TIBC 396 07/02/2019   FERRITIN 15 07/02/2019   Attestation Statements:   Reviewed by clinician on day of visit: allergies, medications, problem list, medical history, surgical history, family history, social history, and previous encounter notes.  I, Water quality scientist, CMA, am acting as Location manager for [employer], DO.  I have reviewed the above documentation for accuracy and completeness, and I agree with the above. Briscoe Deutscher, DO

## 2019-09-16 ENCOUNTER — Ambulatory Visit (INDEPENDENT_AMBULATORY_CARE_PROVIDER_SITE_OTHER): Payer: 59 | Admitting: Family Medicine

## 2019-09-16 ENCOUNTER — Other Ambulatory Visit: Payer: Self-pay

## 2019-09-16 ENCOUNTER — Encounter (INDEPENDENT_AMBULATORY_CARE_PROVIDER_SITE_OTHER): Payer: Self-pay | Admitting: Family Medicine

## 2019-09-16 VITALS — BP 146/81 | HR 86 | Temp 98.1°F | Ht 61.0 in | Wt 161.0 lb

## 2019-09-16 DIAGNOSIS — E7849 Other hyperlipidemia: Secondary | ICD-10-CM | POA: Diagnosis not present

## 2019-09-16 DIAGNOSIS — Z6830 Body mass index (BMI) 30.0-30.9, adult: Secondary | ICD-10-CM

## 2019-09-16 DIAGNOSIS — R7303 Prediabetes: Secondary | ICD-10-CM | POA: Diagnosis not present

## 2019-09-16 DIAGNOSIS — I10 Essential (primary) hypertension: Secondary | ICD-10-CM | POA: Diagnosis not present

## 2019-09-16 DIAGNOSIS — E669 Obesity, unspecified: Secondary | ICD-10-CM

## 2019-09-16 DIAGNOSIS — F3289 Other specified depressive episodes: Secondary | ICD-10-CM

## 2019-09-16 DIAGNOSIS — Z9189 Other specified personal risk factors, not elsewhere classified: Secondary | ICD-10-CM

## 2019-09-16 NOTE — Progress Notes (Signed)
Chief Complaint:   OBESITY Melanie Washington is here to discuss her progress with her obesity treatment plan along with follow-up of her obesity related diagnoses. Melanie Washington is on the Category 2 Plan and keeping a food journal and adhering to recommended goals of 250-300 calories and 20+ grams of protein at breakfast and states she is following her eating plan approximately 75% of the time. Melanie Washington states she is walking for 35 minutes 2 times per week.  Today's visit was #: 5 Starting weight: 173 lbs Starting date: 07/02/2019 Today's weight: 161 lbs Today's date: 09/16/2019 Total lbs lost to date: 12 lbs Total lbs lost since last in-office visit: 2 lbs  Interim History: Melanie Washington says she hated the protein cereal, so she is sticking with eggs for breakfast.  She takes care of her 37-year-old grandson, so she has limited time to exercise.  She reports that she is still walking twice a week.  She is still very happy with her current medications (Wellbutrin, Buspar).  She fells that her clothes are much looser.  Subjective:   1. Essential hypertension Review: taking medications as instructed, no medication side effects noted, no chest pain on exertion, no dyspnea on exertion, no swelling of ankles.  Blood pressure continues to decrease.  BP Readings from Last 3 Encounters:  09/16/19 (!) 146/81  08/26/19 (!) 150/78  07/31/19 (!) 160/85   2. Other hyperlipidemia Melanie Washington has hyperlipidemia and has been trying to improve her cholesterol levels with intensive lifestyle modification including a low saturated fat diet, exercise and weight loss. She denies any chest pain, claudication or myalgias.  Lab Results  Component Value Date   ALT 20 07/02/2019   AST 19 07/02/2019   ALKPHOS 142 (H) 07/02/2019   BILITOT 0.3 07/02/2019   Lab Results  Component Value Date   CHOL 220 (H) 07/02/2019   HDL 44 07/02/2019   LDLCALC 151 (H) 07/02/2019   TRIG 140 07/02/2019   CHOLHDL 5 02/07/2018   3. Prediabetes Melanie Washington has  a diagnosis of prediabetes based on her elevated HgA1c and was informed this puts her at greater risk of developing diabetes. She continues to work on diet and exercise to decrease her risk of diabetes. She denies nausea or hypoglycemia.    Lab Results  Component Value Date   HGBA1C 5.7 (H) 07/02/2019   Lab Results  Component Value Date   INSULIN 14.0 07/02/2019   4. Other depression, with emotional eating  Melanie Washington is struggling with emotional eating and using food for comfort to the extent that it is negatively impacting her health. She has been working on behavior modification techniques to help reduce her emotional eating and has been successful. She shows no sign of suicidal or homicidal ideations.  She is taking Wellbutrin 300 mg daily and Buspar 7.5 mg once daily.   5. At risk for heart disease Melanie Washington is at a higher than average risk for cardiovascular disease due to obesity.   Assessment/Plan:   1. Essential hypertension Tinita is working on healthy weight loss and exercise to improve blood pressure control. We will watch for signs of hypotension as she continues her lifestyle modifications.  Melanie Washington will be seeing her PCP, Dr. Artis Flock, next month.  2. Other hyperlipidemia Cardiovascular risk and specific lipid/LDL goals reviewed.  We discussed several lifestyle modifications today and Melanie Washington will continue to work on diet, exercise and weight loss efforts. Orders and follow up as documented in patient record.  She is due for a lab recheck  next month.  Counseling Intensive lifestyle modifications are the first line treatment for this issue. . Dietary changes: Increase soluble fiber. Decrease simple carbohydrates. . Exercise changes: Moderate to vigorous-intensity aerobic activity 150 minutes per week if tolerated. . Lipid-lowering medications: see documented in medical record.  3. Prediabetes Melanie Washington will continue to work on weight loss, exercise, and decreasing simple carbohydrates to help  decrease the risk of diabetes.  She is due for a lab recheck next month.  4. Other depression, with emotional eating  Behavior modification techniques were discussed today to help Melanie Washington deal with her emotional/non-hunger eating behaviors.  Orders and follow up as documented in patient record.   5. At risk for heart disease Melanie Washington was given approximately 15 minutes of coronary artery disease prevention counseling today. She is 54 y.o. female and has risk factors for heart disease including obesity. We discussed intensive lifestyle modifications today with an emphasis on specific weight loss instructions and strategies.   Repetitive spaced learning was employed today to elicit superior memory formation and behavioral change.  6. Class 1 obesity with serious comorbidity and body mass index (BMI) of 30.0 to 30.9 in adult, unspecified obesity type Melanie Washington is currently in the action stage of change. As such, her goal is to continue with weight loss efforts. She has agreed to the Category 2 Plan.   Exercise goals: For substantial health benefits, adults should do at least 150 minutes (2 hours and 30 minutes) a week of moderate-intensity, or 75 minutes (1 hour and 15 minutes) a week of vigorous-intensity aerobic physical activity, or an equivalent combination of moderate- and vigorous-intensity aerobic activity. Aerobic activity should be performed in episodes of at least 10 minutes, and preferably, it should be spread throughout the week.  Behavioral modification strategies: increasing lean protein intake and increasing water intake.  Melanie Washington has agreed to follow-up with our clinic in 2 weeks. She was informed of the importance of frequent follow-up visits to maximize her success with intensive lifestyle modifications for her multiple health conditions.   Objective:   Blood pressure (!) 146/81, pulse 86, temperature 98.1 F (36.7 C), temperature source Oral, height 5\' 1"  (1.549 m), weight 161 lb (73 kg),  last menstrual period 07/08/2019, SpO2 100 %. Body mass index is 30.42 kg/m.  General: Cooperative, alert, well developed, in no acute distress. HEENT: Conjunctivae and lids unremarkable. Cardiovascular: Regular rhythm.  Lungs: Normal work of breathing. Neurologic: No focal deficits.   Lab Results  Component Value Date   CREATININE 0.68 07/02/2019   BUN 11 07/02/2019   NA 141 07/02/2019   K 3.9 07/02/2019   CL 104 07/02/2019   CO2 22 07/02/2019   Lab Results  Component Value Date   ALT 20 07/02/2019   AST 19 07/02/2019   ALKPHOS 142 (H) 07/02/2019   BILITOT 0.3 07/02/2019   Lab Results  Component Value Date   HGBA1C 5.7 (H) 07/02/2019   Lab Results  Component Value Date   INSULIN 14.0 07/02/2019   Lab Results  Component Value Date   TSH 2.980 07/02/2019   Lab Results  Component Value Date   CHOL 220 (H) 07/02/2019   HDL 44 07/02/2019   LDLCALC 151 (H) 07/02/2019   TRIG 140 07/02/2019   CHOLHDL 5 02/07/2018   Lab Results  Component Value Date   WBC 6.9 07/02/2019   HGB 13.4 07/02/2019   HCT 40.5 07/02/2019   MCV 84 07/02/2019   PLT 386 07/02/2019   Lab Results  Component Value  Date   IRON 59 07/02/2019   TIBC 396 07/02/2019   FERRITIN 15 07/02/2019   Attestation Statements:   Reviewed by clinician on day of visit: allergies, medications, problem list, medical history, surgical history, family history, social history, and previous encounter notes.  I, Insurance claims handler, CMA, am acting as Energy manager for [employer], DO.  I have reviewed the above documentation for accuracy and completeness, and I agree with the above. Helane Rima, DO

## 2019-10-09 ENCOUNTER — Encounter (INDEPENDENT_AMBULATORY_CARE_PROVIDER_SITE_OTHER): Payer: Self-pay | Admitting: Family Medicine

## 2019-10-09 ENCOUNTER — Other Ambulatory Visit: Payer: Self-pay

## 2019-10-09 ENCOUNTER — Ambulatory Visit (INDEPENDENT_AMBULATORY_CARE_PROVIDER_SITE_OTHER): Payer: 59 | Admitting: Family Medicine

## 2019-10-09 VITALS — BP 154/79 | HR 77 | Temp 98.1°F | Ht 61.0 in | Wt 160.0 lb

## 2019-10-09 DIAGNOSIS — F329 Major depressive disorder, single episode, unspecified: Secondary | ICD-10-CM

## 2019-10-09 DIAGNOSIS — F32A Depression, unspecified: Secondary | ICD-10-CM

## 2019-10-09 DIAGNOSIS — I10 Essential (primary) hypertension: Secondary | ICD-10-CM

## 2019-10-09 DIAGNOSIS — R7303 Prediabetes: Secondary | ICD-10-CM

## 2019-10-09 DIAGNOSIS — Z9189 Other specified personal risk factors, not elsewhere classified: Secondary | ICD-10-CM

## 2019-10-09 DIAGNOSIS — E669 Obesity, unspecified: Secondary | ICD-10-CM

## 2019-10-09 DIAGNOSIS — Z6830 Body mass index (BMI) 30.0-30.9, adult: Secondary | ICD-10-CM

## 2019-10-09 DIAGNOSIS — E7849 Other hyperlipidemia: Secondary | ICD-10-CM

## 2019-10-09 DIAGNOSIS — F419 Anxiety disorder, unspecified: Secondary | ICD-10-CM

## 2019-10-09 NOTE — Progress Notes (Signed)
Chief Complaint:   OBESITY Melanie Washington is here to discuss her progress with her obesity treatment plan along with follow-up of her obesity related diagnoses. Melanie Washington is on the Category 2 Plan and states she is following her eating plan approximately 75-80% of the time. Melanie Washington states she is walking for 20 minutes 3 times per week.  Today's visit was #: 6 Starting weight: 173 lbs Starting date: 07/02/2019 Today's weight: 160 lbs Today's date: 10/09/2019 Total lbs lost to date: 13 lbs Total lbs lost since last in-office visit: 1 lb  Interim History: Melanie Washington is under increased stress at work due to using a new system. She is still with her grandson each weekend.  She is having some insomnia due to stress as well as some emotional eating due to stress.  Subjective:   1. Prediabetes Wynne has a diagnosis of prediabetes based on her elevated HgA1c and was informed this puts her at greater risk of developing diabetes. She continues to work on diet and exercise to decrease her risk of diabetes. She denies nausea or hypoglycemia.  Lab Results  Component Value Date   HGBA1C 5.7 (H) 07/02/2019   Lab Results  Component Value Date   INSULIN 14.0 07/02/2019   2. Other hyperlipidemia Melanie Washington has hyperlipidemia and has been trying to improve her cholesterol levels with intensive lifestyle modification including a low saturated fat diet, exercise and weight loss. She denies any chest pain, claudication or myalgias.  Lab Results  Component Value Date   ALT 20 07/02/2019   AST 19 07/02/2019   ALKPHOS 142 (H) 07/02/2019   BILITOT 0.3 07/02/2019   Lab Results  Component Value Date   CHOL 220 (H) 07/02/2019   HDL 44 07/02/2019   LDLCALC 151 (H) 07/02/2019   TRIG 140 07/02/2019   CHOLHDL 5 02/07/2018   3. Essential hypertension Review: taking medications as instructed, no medication side effects noted, no chest pain on exertion, no dyspnea on exertion, no swelling of ankles.  Home blood pressure  120s/70s, manual, daughter takes it.  She was instructed to bring her home BP log with her to her next visit with her PCP.  BP Readings from Last 3 Encounters:  10/09/19 (!) 154/79  09/16/19 (!) 146/81  08/26/19 (!) 150/78   4. Anxiety and depression, with emotional eating Melanie Washington is struggling with emotional eating and using food for comfort to the extent that it is negatively impacting her health. She has been working on behavior modification techniques to help reduce her emotional eating and has been successful. She shows no sign of suicidal or homicidal ideations.  She is taking Wellbutrin 300 mg daily and Buspar 7.5 mg three times daily as needed.  She is taking Buspar 1/2 tablet per day.  5. At risk for heart disease Melanie Washington is at a higher than average risk for cardiovascular disease due to obesity.   Assessment/Plan:   1. Prediabetes Melanie Washington will continue to work on weight loss, exercise, and decreasing simple carbohydrates to help decrease the risk of diabetes.   Orders - Comprehensive metabolic panel - CBC with Differential/Platelet - Hemoglobin A1c - VITAMIN D 25 Hydroxy (Vit-D Deficiency, Fractures)  2. Other hyperlipidemia Cardiovascular risk and specific lipid/LDL goals reviewed.  We discussed several lifestyle modifications today and Melanie Washington will continue to work on diet, exercise and weight loss efforts. Orders and follow up as documented in patient record.   Counseling Intensive lifestyle modifications are the first line treatment for this issue. . Dietary changes:  Increase soluble fiber. Decrease simple carbohydrates. . Exercise changes: Moderate to vigorous-intensity aerobic activity 150 minutes per week if tolerated. . Lipid-lowering medications: see documented in medical record.  Orders - Comprehensive metabolic panel - CBC with Differential/Platelet - Lipid Panel With LDL/HDL Ratio - VITAMIN D 25 Hydroxy (Vit-D Deficiency, Fractures)  3. Essential  hypertension Melanie Washington is working on healthy weight loss and exercise to improve blood pressure control. We will watch for signs of hypotension as she continues her lifestyle modifications. - Comprehensive metabolic panel - CBC with Differential/Platelet - VITAMIN D 25 Hydroxy (Vit-D Deficiency, Fractures)  4. Anxiety and depression, with emotional eating Behavior modification techniques were discussed today to help Uma deal with her emotional/non-hunger eating behaviors.  Orders and follow up as documented in patient record.   5. At risk for heart disease Melanie Washington was given approximately 15 minutes of coronary artery disease prevention counseling today. She is 54 y.o. female and has risk factors for heart disease including obesity. We discussed intensive lifestyle modifications today with an emphasis on specific weight loss instructions and strategies.   Repetitive spaced learning was employed today to elicit superior memory formation and behavioral change.  6. Class 1 obesity with serious comorbidity and body mass index (BMI) of 30.0 to 30.9 in adult, unspecified obesity type Melanie Washington is currently in the action stage of change. As such, her goal is to continue with weight loss efforts. She has agreed to the Category 2 Plan.   Exercise goals: For substantial health benefits, adults should do at least 150 minutes (2 hours and 30 minutes) a week of moderate-intensity, or 75 minutes (1 hour and 15 minutes) a week of vigorous-intensity aerobic physical activity, or an equivalent combination of moderate- and vigorous-intensity aerobic activity. Aerobic activity should be performed in episodes of at least 10 minutes, and preferably, it should be spread throughout the week.  Behavioral modification strategies: increasing lean protein intake and increasing water intake.  Melanie Washington has agreed to follow-up with our clinic in 2-3 weeks. She was informed of the importance of frequent follow-up visits to maximize her  success with intensive lifestyle modifications for her multiple health conditions.   Melanie Washington was informed we would discuss her lab results at her next visit unless there is a critical issue that needs to be addressed sooner. Melanie Washington agreed to keep her next visit at the agreed upon time to discuss these results.  Objective:   Blood pressure (!) 154/79, pulse 77, temperature 98.1 F (36.7 C), temperature source Oral, height 5\' 1"  (1.549 m), weight 160 lb (72.6 kg), SpO2 98 %. Body mass index is 30.23 kg/m.  General: Cooperative, alert, well developed, in no acute distress. HEENT: Conjunctivae and lids unremarkable. Cardiovascular: Regular rhythm.  Lungs: Normal work of breathing. Neurologic: No focal deficits.   Lab Results  Component Value Date   CREATININE 0.68 07/02/2019   BUN 11 07/02/2019   NA 141 07/02/2019   K 3.9 07/02/2019   CL 104 07/02/2019   CO2 22 07/02/2019   Lab Results  Component Value Date   ALT 20 07/02/2019   AST 19 07/02/2019   ALKPHOS 142 (H) 07/02/2019   BILITOT 0.3 07/02/2019   Lab Results  Component Value Date   HGBA1C 5.7 (H) 07/02/2019   Lab Results  Component Value Date   INSULIN 14.0 07/02/2019   Lab Results  Component Value Date   TSH 2.980 07/02/2019   Lab Results  Component Value Date   CHOL 220 (H) 07/02/2019   HDL  44 07/02/2019   LDLCALC 151 (H) 07/02/2019   TRIG 140 07/02/2019   CHOLHDL 5 02/07/2018   Lab Results  Component Value Date   WBC 6.9 07/02/2019   HGB 13.4 07/02/2019   HCT 40.5 07/02/2019   MCV 84 07/02/2019   PLT 386 07/02/2019   Lab Results  Component Value Date   IRON 59 07/02/2019   TIBC 396 07/02/2019   FERRITIN 15 07/02/2019   Attestation Statements:   Reviewed by clinician on day of visit: allergies, medications, problem list, medical history, surgical history, family history, social history, and previous encounter notes.  I, Insurance claims handler, CMA, am acting as transcriptionist for Helane Rima, DO  I  have reviewed the above documentation for accuracy and completeness, and I agree with the above. Helane Rima, DO

## 2019-10-10 LAB — COMPREHENSIVE METABOLIC PANEL
ALT: 15 IU/L (ref 0–32)
AST: 14 IU/L (ref 0–40)
Albumin/Globulin Ratio: 1.2 (ref 1.2–2.2)
Albumin: 4.3 g/dL (ref 3.8–4.9)
Alkaline Phosphatase: 127 IU/L — ABNORMAL HIGH (ref 48–121)
BUN/Creatinine Ratio: 17 (ref 9–23)
BUN: 14 mg/dL (ref 6–24)
Bilirubin Total: 0.3 mg/dL (ref 0.0–1.2)
CO2: 24 mmol/L (ref 20–29)
Calcium: 9.4 mg/dL (ref 8.7–10.2)
Chloride: 105 mmol/L (ref 96–106)
Creatinine, Ser: 0.83 mg/dL (ref 0.57–1.00)
GFR calc Af Amer: 94 mL/min/{1.73_m2} (ref >59)
GFR calc non Af Amer: 82 mL/min/{1.73_m2} (ref >59)
Globulin, Total: 3.5 g/dL (ref 1.5–4.5)
Glucose: 94 mg/dL (ref 65–99)
Potassium: 4.3 mmol/L (ref 3.5–5.2)
Sodium: 143 mmol/L (ref 134–144)
Total Protein: 7.8 g/dL (ref 6.0–8.5)

## 2019-10-10 LAB — CBC WITH DIFFERENTIAL/PLATELET
Basophils Absolute: 0.1 10*3/uL (ref 0.0–0.2)
Basos: 1 %
EOS (ABSOLUTE): 0.2 10*3/uL (ref 0.0–0.4)
Eos: 2 %
Hematocrit: 39.4 % (ref 34.0–46.6)
Hemoglobin: 12.7 g/dL (ref 11.1–15.9)
Immature Grans (Abs): 0 10*3/uL (ref 0.0–0.1)
Immature Granulocytes: 0 %
Lymphocytes Absolute: 2.8 10*3/uL (ref 0.7–3.1)
Lymphs: 44 %
MCH: 28.3 pg (ref 26.6–33.0)
MCHC: 32.2 g/dL (ref 31.5–35.7)
MCV: 88 fL (ref 79–97)
Monocytes Absolute: 0.5 10*3/uL (ref 0.1–0.9)
Monocytes: 8 %
Neutrophils Absolute: 2.9 10*3/uL (ref 1.4–7.0)
Neutrophils: 45 %
Platelets: 321 10*3/uL (ref 150–450)
RBC: 4.49 x10E6/uL (ref 3.77–5.28)
RDW: 15.8 % — ABNORMAL HIGH (ref 11.7–15.4)
WBC: 6.3 10*3/uL (ref 3.4–10.8)

## 2019-10-10 LAB — LIPID PANEL WITH LDL/HDL RATIO
Cholesterol, Total: 211 mg/dL — ABNORMAL HIGH (ref 100–199)
HDL: 40 mg/dL (ref >39)
LDL Chol Calc (NIH): 154 mg/dL — ABNORMAL HIGH (ref 0–99)
LDL/HDL Ratio: 3.9 ratio — ABNORMAL HIGH (ref 0.0–3.2)
Triglycerides: 95 mg/dL (ref 0–149)
VLDL Cholesterol Cal: 17 mg/dL (ref 5–40)

## 2019-10-10 LAB — VITAMIN D 25 HYDROXY (VIT D DEFICIENCY, FRACTURES): Vit D, 25-Hydroxy: 63.1 ng/mL (ref 30.0–100.0)

## 2019-10-10 LAB — HEMOGLOBIN A1C
Est. average glucose Bld gHb Est-mCnc: 128 mg/dL
Hgb A1c MFr Bld: 6.1 % — ABNORMAL HIGH (ref 4.8–5.6)

## 2019-10-23 ENCOUNTER — Ambulatory Visit (INDEPENDENT_AMBULATORY_CARE_PROVIDER_SITE_OTHER): Payer: 59 | Admitting: Family Medicine

## 2019-10-23 ENCOUNTER — Other Ambulatory Visit: Payer: Self-pay

## 2019-10-23 ENCOUNTER — Encounter: Payer: Self-pay | Admitting: Family Medicine

## 2019-10-23 VITALS — BP 140/86 | HR 79 | Temp 98.1°F | Ht 61.0 in | Wt 160.4 lb

## 2019-10-23 DIAGNOSIS — F411 Generalized anxiety disorder: Secondary | ICD-10-CM | POA: Diagnosis not present

## 2019-10-23 DIAGNOSIS — R030 Elevated blood-pressure reading, without diagnosis of hypertension: Secondary | ICD-10-CM | POA: Diagnosis not present

## 2019-10-23 DIAGNOSIS — R7303 Prediabetes: Secondary | ICD-10-CM

## 2019-10-23 NOTE — Patient Instructions (Signed)
-  blood pressure good on repeat. Will watch your pressures with visits to Dr. Earlene Plater office's. Continue to monitor at home. Let me know if blood pressure consistently >140/90. Let's just keep your regular follow up in 6 months unless we need to start bp medication.   im so proud of you for your weight loss! Enjoy your few days off coming up, much deserved and needed.   Dr. Artis Flock

## 2019-10-23 NOTE — Progress Notes (Signed)
Patient: Melanie Washington MRN: [id number] DOB: [birth year] PCP: Orland Mustard, MD     Subjective:  Chief Complaint  Patient presents with  . Hypertension  . Anxiety    HPI: The patient is a 54 y.o. female who presents today for Hypertension and Anxiety. Pt says that she is doing well with Buspar.  High blood pressure Has had multiple high readings. When is aw her for her annual she was supposed to get a cuff and did not do this. Have given her 3 months to work on weight loss, low salt diet and she is here for follow up. She has lost 13 pounds with the weight loss clinic. She is walking for exercise. She also brought her log: readings: 133-151/66-78. She states her blood pressure is higher at work and to goal on the weekends.   The 10-year ASCVD risk score Denman George DC Montez Hageman., et al., 2013) is: 2.5%  Anxiety Back in march we changed her from trintellix to buspar due to cost. She is tolerating buspar well and feels like her anxiety is controlled.   Prediabetes a1c checked 2 weeks ago at weight loss clinic. It was 6.1 from 5.7 despite 13 pound weight loss.   Review of Systems  Constitutional: Negative for chills, fatigue and fever.  HENT: Negative for dental problem, ear pain, hearing loss and trouble swallowing.   Eyes: Negative for visual disturbance.  Respiratory: Negative for cough, chest tightness and shortness of breath.   Cardiovascular: Negative for chest pain, palpitations and leg swelling.  Gastrointestinal: Negative for abdominal pain, blood in stool, diarrhea and nausea.  Endocrine: Negative for cold intolerance, polydipsia, polyphagia and polyuria.  Genitourinary: Negative for dysuria, frequency, hematuria and urgency.  Musculoskeletal: Negative for arthralgias.  Skin: Negative for rash.  Neurological: Negative for dizziness and headaches.  Psychiatric/Behavioral: Negative for dysphoric mood and sleep disturbance. The patient is not nervous/anxious.     Allergies Patient  has No Known Allergies.  Past Medical History Patient  has a past medical history of Anxiety, Back pain, Depression, Fear of other medical care, GERD (gastroesophageal reflux disease), Iron deficiency anemia, Joint pain, and Mixed hyperlipidemia.  Surgical History Patient  has a past surgical history that includes Cesarean section.  Family History Pateint's family history includes Alcohol abuse in her maternal grandfather, paternal grandfather, and paternal grandmother; Asthma in her brother; COPD in her maternal grandmother and paternal grandmother; Cancer in her father and maternal grandfather; Depression in her brother; Diabetes in her father; Early death in her father and maternal grandfather; Hyperlipidemia in her father, maternal grandfather, and mother; Hypertension in her father and mother; Kidney disease in her father; Stroke in her father.  Social History Patient  reports that she has never smoked. She has never used smokeless tobacco. She reports previous alcohol use. She reports previous drug use.    Objective: Vitals:   10/23/19 0836 10/23/19 0900  BP: (!) 160/80 140/86  Pulse: 79   Temp: 98.1 F (36.7 C)   TempSrc: Temporal   SpO2: 97%   Weight: 160 lb 6.4 oz (72.8 kg)   Height: 5\' 1"  (1.549 m)     Body mass index is 30.31 kg/m.  Physical Exam Vitals reviewed.  Constitutional:      Appearance: Normal appearance. She is obese.  HENT:     Head: Normocephalic and atraumatic.     Right Ear: Tympanic membrane, ear canal and external ear normal.     Left Ear: Tympanic membrane, ear canal and external ear normal.  Cardiovascular:     Rate and Rhythm: Normal rate and regular rhythm.     Heart sounds: Normal heart sounds.  Pulmonary:     Effort: Pulmonary effort is normal.     Breath sounds: Normal breath sounds.  Abdominal:     General: Bowel sounds are normal.     Palpations: Abdomen is soft.  Neurological:     General: No focal deficit present.     Mental  Status: She is alert and oriented to person, place, and time.  Psychiatric:        Mood and Affect: Mood normal.        Behavior: Behavior normal.        GAD 7 : Generalized Anxiety Score 10/23/2019 07/24/2019 05/10/2019 02/07/2018  Nervous, Anxious, on Edge 1 1 1 1   Control/stop worrying 1 3 3 2   Worry too much - different things 1 2 3 3   Trouble relaxing 1 1 1 2   Restless 0 0 0 1  Easily annoyed or irritable 1 1 0 1  Afraid - awful might happen 0 0 1 1  Total GAD 7 Score 5 8 9 11   Anxiety Difficulty - Not difficult at all Not difficult at all Not difficult at all     Assessment/plan: 1. GAD (generalized anxiety disorder) GAD7 score significantly improved with buspar despite increased stress at work. Continue current medication, working well. No refills needed. F/u in 6 months time.   2. Elevated blood pressure reading Readings near to goal. Since she is continuing to lose weight and blood pressure readings have been trending downwards, im okay giving her some more time to continue to work on weight loss. She will continue to keep a log for me and discussed if blood pressure consistently >140/90 she is to let me know so we can initiate medication. She is also followed regularly at weight loss clinic with vital checks to have close surveillance.   3. Prediabetes Recent a1c of 6.1. continue to monitor. She is working on weight loss and diet and exercise and doing great with her weight loss. F/u in 6 months time.     This visit occurred during the SARS-CoV-2 public health emergency.  Safety protocols were in place, including screening questions prior to the visit, additional usage of staff PPE, and extensive cleaning of exam room while observing appropriate contact time as indicated for disinfecting solutions.     Return in about 6 months (around 04/23/2020) for routine f/u. anxiety/pediabetes. , MD [facility]   10/23/2019

## 2019-11-06 ENCOUNTER — Ambulatory Visit (INDEPENDENT_AMBULATORY_CARE_PROVIDER_SITE_OTHER): Payer: 59 | Admitting: Family Medicine

## 2019-11-06 ENCOUNTER — Encounter (INDEPENDENT_AMBULATORY_CARE_PROVIDER_SITE_OTHER): Payer: Self-pay | Admitting: Family Medicine

## 2019-11-06 ENCOUNTER — Other Ambulatory Visit: Payer: Self-pay

## 2019-11-06 VITALS — BP 133/78 | HR 82 | Temp 98.2°F | Ht 61.0 in | Wt 157.0 lb

## 2019-11-06 DIAGNOSIS — Z9189 Other specified personal risk factors, not elsewhere classified: Secondary | ICD-10-CM | POA: Diagnosis not present

## 2019-11-06 DIAGNOSIS — E559 Vitamin D deficiency, unspecified: Secondary | ICD-10-CM

## 2019-11-06 DIAGNOSIS — R030 Elevated blood-pressure reading, without diagnosis of hypertension: Secondary | ICD-10-CM | POA: Diagnosis not present

## 2019-11-06 DIAGNOSIS — R7303 Prediabetes: Secondary | ICD-10-CM | POA: Diagnosis not present

## 2019-11-06 DIAGNOSIS — Z6830 Body mass index (BMI) 30.0-30.9, adult: Secondary | ICD-10-CM

## 2019-11-06 DIAGNOSIS — E7849 Other hyperlipidemia: Secondary | ICD-10-CM | POA: Diagnosis not present

## 2019-11-06 DIAGNOSIS — E669 Obesity, unspecified: Secondary | ICD-10-CM

## 2019-11-06 NOTE — Progress Notes (Signed)
Chief Complaint:   OBESITY Melanie Washington is here to discuss her progress with her obesity treatment plan along with follow-up of her obesity related diagnoses. Melanie Washington is on the Category 2 Plan and states she is following her eating plan approximately 60% of the time. Melanie Washington states she is exercising for 0 minutes 0 times per week.  Today's visit was #: 7 Starting weight: 173 lbs Starting date: 07/02/2019 Today's weight: 157 lbs Today's date: 11/06/2019 Total lbs lost to date: 13 lbs Total lbs lost since last in-office visit: 3 lbs  Interim History: Melanie Washington says that work is still stressful but is manageable.  She says that it is too hot to walk.  She went swimming last weekend and loved it!  She plans to go more often.  Subjective:   1. Prediabetes Melanie Washington has a diagnosis of prediabetes based on her elevated HgA1c and was informed this puts her at greater risk of developing diabetes. She continues to work on diet and exercise to decrease her risk of diabetes. She denies nausea or hypoglycemia.  Her A1c has gone from 5.7 to 6.1.  Lab Results  Component Value Date   HGBA1C 6.1 (H) 10/09/2019   Lab Results  Component Value Date   INSULIN 14.0 07/02/2019   2. Other hyperlipidemia Melanie Washington has hyperlipidemia and has been trying to improve her cholesterol levels with intensive lifestyle modification including a low saturated fat diet, exercise and weight loss. She denies any chest pain, claudication or myalgias.    Lab Results  Component Value Date   ALT 15 10/09/2019   AST 14 10/09/2019   ALKPHOS 127 (H) 10/09/2019   BILITOT 0.3 10/09/2019   Lab Results  Component Value Date   CHOL 211 (H) 10/09/2019   HDL 40 10/09/2019   LDLCALC 154 (H) 10/09/2019   TRIG 95 10/09/2019   CHOLHDL 5 02/07/2018   3. Vitamin D deficiency Melanie Washington's Vitamin D level was 63.1 on 10/09/2019. She is currently taking no vitamin D supplement. She denies nausea, vomiting or muscle weakness.  Vitamin D level is at  goal.  4. Elevated blood pressure reading Review: taking medications as instructed, no medication side effects noted, no chest pain on exertion, no dyspnea on exertion, no swelling of ankles.  Blood pressure is at goal today.  BP Readings from Last 3 Encounters:  11/06/19 133/78  10/23/19 140/86  10/09/19 (!) 154/79   Assessment/Plan:   1. Prediabetes Melanie Washington will continue to work on weight loss, exercise, and decreasing simple carbohydrates to help decrease the risk of diabetes.   2. Other hyperlipidemia Cardiovascular risk and specific lipid/LDL goals reviewed.  We discussed several lifestyle modifications today and Melanie Washington will continue to work on diet, exercise and weight loss efforts. Orders and follow up as documented in patient record.  Consider fiber supplement.  Counseling Intensive lifestyle modifications are the first line treatment for this issue. . Dietary changes: Increase soluble fiber. Decrease simple carbohydrates. . Exercise changes: Moderate to vigorous-intensity aerobic activity 150 minutes per week if tolerated. . Lipid-lowering medications: see documented in medical record.  3. Vitamin D deficiency Low Vitamin D level contributes to fatigue and are associated with obesity, breast, and colon cancer.  4. Elevated blood pressure reading Melanie Washington is working on healthy weight loss and exercise to improve blood pressure control. We will watch for signs of hypotension as she continues her lifestyle modifications.  5. At risk for heart disease Melanie Washington was given approximately 15 minutes of coronary artery disease prevention  counseling today. She is 54 y.o. female and has risk factors for heart disease including obesity. We discussed intensive lifestyle modifications today with an emphasis on specific weight loss instructions and strategies.   Repetitive spaced learning was employed today to elicit superior memory formation and behavioral change.  6. Class 1 obesity with serious  comorbidity and body mass index (BMI) of 30.0 to 30.9 in adult, unspecified obesity type Melanie Washington is currently in the action stage of change. As such, her goal is to continue with weight loss efforts. She has agreed to the Category 2 Plan.   Exercise goals: For substantial health benefits, adults should do at least 150 minutes (2 hours and 30 minutes) a week of moderate-intensity, or 75 minutes (1 hour and 15 minutes) a week of vigorous-intensity aerobic physical activity, or an equivalent combination of moderate- and vigorous-intensity aerobic activity. Aerobic activity should be performed in episodes of at least 10 minutes, and preferably, it should be spread throughout the week. Swimming.  Behavioral modification strategies: increasing lean protein intake, decreasing simple carbohydrates and increasing high fiber foods.  Melanie Washington has agreed to follow-up with our clinic in 2-3 weeks. She was informed of the importance of frequent follow-up visits to maximize her success with intensive lifestyle modifications for her multiple health conditions.   Objective:   Blood pressure 133/78, pulse 82, temperature 98.2 F (36.8 C), temperature source Oral, height 5\' 1"  (1.549 m), weight 157 lb (71.2 kg), SpO2 99 %. Body mass index is 29.66 kg/m.  General: Cooperative, alert, well developed, in no acute distress. HEENT: Conjunctivae and lids unremarkable. Cardiovascular: Regular rhythm.  Lungs: Normal work of breathing. Neurologic: No focal deficits.   Lab Results  Component Value Date   CREATININE 0.83 10/09/2019   BUN 14 10/09/2019   NA 143 10/09/2019   K 4.3 10/09/2019   CL 105 10/09/2019   CO2 24 10/09/2019   Lab Results  Component Value Date   ALT 15 10/09/2019   AST 14 10/09/2019   ALKPHOS 127 (H) 10/09/2019   BILITOT 0.3 10/09/2019   Lab Results  Component Value Date   HGBA1C 6.1 (H) 10/09/2019   HGBA1C 5.7 (H) 07/02/2019   Lab Results  Component Value Date   INSULIN 14.0  07/02/2019   Lab Results  Component Value Date   TSH 2.980 07/02/2019   Lab Results  Component Value Date   CHOL 211 (H) 10/09/2019   HDL 40 10/09/2019   LDLCALC 154 (H) 10/09/2019   TRIG 95 10/09/2019   CHOLHDL 5 02/07/2018   Lab Results  Component Value Date   WBC 6.3 10/09/2019   HGB 12.7 10/09/2019   HCT 39.4 10/09/2019   MCV 88 10/09/2019   PLT 321 10/09/2019   Lab Results  Component Value Date   IRON 59 07/02/2019   TIBC 396 07/02/2019   FERRITIN 15 07/02/2019   Attestation Statements:   Reviewed by clinician on day of visit: allergies, medications, problem list, medical history, surgical history, family history, social history, and previous encounter notes.  I, 09/01/2019, CMA, am acting as transcriptionist for Insurance claims handler, DO  I have reviewed the above documentation for accuracy and completeness, and I agree with the above. Helane Rima, DO

## 2019-12-02 ENCOUNTER — Other Ambulatory Visit: Payer: Self-pay

## 2019-12-02 ENCOUNTER — Ambulatory Visit (INDEPENDENT_AMBULATORY_CARE_PROVIDER_SITE_OTHER): Payer: 59 | Admitting: Family Medicine

## 2019-12-02 ENCOUNTER — Encounter (INDEPENDENT_AMBULATORY_CARE_PROVIDER_SITE_OTHER): Payer: Self-pay | Admitting: Family Medicine

## 2019-12-02 VITALS — BP 125/77 | HR 80 | Temp 98.6°F | Ht 61.0 in | Wt 156.0 lb

## 2019-12-02 DIAGNOSIS — E7849 Other hyperlipidemia: Secondary | ICD-10-CM | POA: Diagnosis not present

## 2019-12-02 DIAGNOSIS — E669 Obesity, unspecified: Secondary | ICD-10-CM

## 2019-12-02 DIAGNOSIS — Z9189 Other specified personal risk factors, not elsewhere classified: Secondary | ICD-10-CM

## 2019-12-02 DIAGNOSIS — F329 Major depressive disorder, single episode, unspecified: Secondary | ICD-10-CM

## 2019-12-02 DIAGNOSIS — E7800 Pure hypercholesterolemia, unspecified: Secondary | ICD-10-CM | POA: Insufficient documentation

## 2019-12-02 DIAGNOSIS — R7303 Prediabetes: Secondary | ICD-10-CM | POA: Diagnosis not present

## 2019-12-02 DIAGNOSIS — F419 Anxiety disorder, unspecified: Secondary | ICD-10-CM | POA: Diagnosis not present

## 2019-12-02 DIAGNOSIS — F32A Depression, unspecified: Secondary | ICD-10-CM

## 2019-12-02 DIAGNOSIS — Z6830 Body mass index (BMI) 30.0-30.9, adult: Secondary | ICD-10-CM

## 2019-12-02 NOTE — Progress Notes (Signed)
Chief Complaint:   OBESITY Melanie Washington is here to discuss her progress with her obesity treatment plan along with follow-up of her obesity related diagnoses. Melanie Washington is on the Category 2 Plan and states she is following her eating plan approximately 50% of the time. Melanie Washington states she is exercising by doing more physical labor at work.  Today's visit was #: 8 Starting weight: 173 lbs Starting date: 07/02/2019 Today's weight: 156 lbs Today's date: 12/02/2019 Total lbs lost to date: 14 lbs Total lbs lost since last in-office visit: 1 lb Total weight loss percentage to date: -9.83%  Interim History: Melanie Washington's blood pressure looks great.  She says she is too busy at work to eat lunch.  She eats a protein bar instead.  She is still eating eggs in the morning.  Her sleep is poor.  She says she has been up since 3 am.  Subjective:   1. Prediabetes  Lab Results  Component Value Date   HGBA1C 6.1 (H) 10/09/2019   Lab Results  Component Value Date   INSULIN 14.0 07/02/2019   2. Other hyperlipidemia  Lab Results  Component Value Date   ALT 15 10/09/2019   AST 14 10/09/2019   ALKPHOS 127 (H) 10/09/2019   BILITOT 0.3 10/09/2019   Lab Results  Component Value Date   CHOL 211 (H) 10/09/2019   HDL 40 10/09/2019   LDLCALC 154 (H) 10/09/2019   TRIG 95 10/09/2019   CHOLHDL 5 02/07/2018   3. Anxiety and depression, with emotional eating Alpa is struggling with emotional eating and using food for comfort to the extent that it is negatively impacting her health. She has been working on behavior modification techniques to help reduce her emotional eating and has been successful.   Assessment/Plan:   1. Prediabetes Not at goal. Goal is HgbA1c < 5.7 and insulin level closer to 5. Melanie Washington will continue to work on weight loss, exercise, and decreasing simple carbohydrates to help decrease the risk of diabetes.   2. Other hyperlipidemia Not at goal. Cardiovascular risk and specific lipid/LDL goals  reviewed.  We discussed several lifestyle modifications today and Melanie Washington will continue to work on diet, exercise and weight loss efforts. Orders and follow up as documented in patient record.   3. Anxiety and depression, with emotional eating Behavior modification techniques were discussed today to help Melanie Washington deal with her emotional/non-hunger eating behaviors.  Orders and follow up as documented in patient record.   4. At risk for heart disease Melanie Washington was given approximately 15 minutes of coronary artery disease prevention counseling today. She is 54 y.o. female and has risk factors for heart disease including obesity and metabolic syndrome. We discussed intensive lifestyle modifications today with an emphasis on specific weight loss instructions and strategies.   During insulin resistance, several metabolic alterations induce the development of cardiovascular disease. For instance, insulin resistance can induce an imbalance in glucose metabolism that generates chronic hyperglycemia, which in turn triggers oxidative stress and causes an inflammatory response that leads to cell damage. Insulin resistance can also alter systemic lipid metabolism which then leads to the development of dyslipidemia and the well-known lipid triad: (1) high levels of plasma triglycerides, (2) low levels of high-density lipoprotein, and (3) the appearance of small dense low-density lipoproteins. This triad, along with endothelial dysfunction, which can also be induced by aberrant insulin signaling, contribute to atherosclerotic plaque formation.   Repetitive spaced learning was employed today to elicit superior memory formation and behavioral change.  5. Class 1 obesity with serious comorbidity and body mass index (BMI) of 30.0 to 30.9 in adult, unspecified obesity type Melanie Washington is currently in the action stage of change. As such, her goal is to continue with weight loss efforts. She has agreed to the Category 2 Plan +400-500  calories and 30+ grams of protein at supper.   Exercise goals: For substantial health benefits, adults should do at least 150 minutes (2 hours and 30 minutes) a week of moderate-intensity, or 75 minutes (1 hour and 15 minutes) a week of vigorous-intensity aerobic physical activity, or an equivalent combination of moderate- and vigorous-intensity aerobic activity. Aerobic activity should be performed in episodes of at least 10 minutes, and preferably, it should be spread throughout the week.  Behavioral modification strategies: increasing lean protein intake, increasing water intake and increasing high fiber foods.  Melanie Washington has agreed to follow-up with our clinic in 3-4 weeks. She was informed of the importance of frequent follow-up visits to maximize her success with intensive lifestyle modifications for her multiple health conditions.   Objective:   Blood pressure 125/77, pulse 80, temperature 98.6 F (37 C), temperature source Oral, height 5\' 1"  (1.549 m), weight 156 lb (70.8 kg), SpO2 100 %. Body mass index is 29.48 kg/m.  General: Cooperative, alert, well developed, in no acute distress. HEENT: Conjunctivae and lids unremarkable. Cardiovascular: Regular rhythm.  Lungs: Normal work of breathing. Neurologic: No focal deficits.   Lab Results  Component Value Date   CREATININE 0.83 10/09/2019   BUN 14 10/09/2019   NA 143 10/09/2019   K 4.3 10/09/2019   CL 105 10/09/2019   CO2 24 10/09/2019   Lab Results  Component Value Date   ALT 15 10/09/2019   AST 14 10/09/2019   ALKPHOS 127 (H) 10/09/2019   BILITOT 0.3 10/09/2019   Lab Results  Component Value Date   HGBA1C 6.1 (H) 10/09/2019   HGBA1C 5.7 (H) 07/02/2019   Lab Results  Component Value Date   INSULIN 14.0 07/02/2019   Lab Results  Component Value Date   TSH 2.980 07/02/2019   Lab Results  Component Value Date   CHOL 211 (H) 10/09/2019   HDL 40 10/09/2019   LDLCALC 154 (H) 10/09/2019   TRIG 95 10/09/2019    CHOLHDL 5 02/07/2018   Lab Results  Component Value Date   WBC 6.3 10/09/2019   HGB 12.7 10/09/2019   HCT 39.4 10/09/2019   MCV 88 10/09/2019   PLT 321 10/09/2019   Lab Results  Component Value Date   IRON 59 07/02/2019   TIBC 396 07/02/2019   FERRITIN 15 07/02/2019   Attestation Statements:   Reviewed by clinician on day of visit: allergies, medications, problem list, medical history, surgical history, family history, social history, and previous encounter notes.  I, 09/01/2019, CMA, am acting as transcriptionist for Insurance claims handler, DO  I have reviewed the above documentation for accuracy and completeness, and I agree with the above. Helane Rima, DO

## 2019-12-31 ENCOUNTER — Other Ambulatory Visit: Payer: Self-pay

## 2019-12-31 ENCOUNTER — Ambulatory Visit (INDEPENDENT_AMBULATORY_CARE_PROVIDER_SITE_OTHER): Payer: 59 | Admitting: Family Medicine

## 2019-12-31 ENCOUNTER — Encounter (INDEPENDENT_AMBULATORY_CARE_PROVIDER_SITE_OTHER): Payer: Self-pay | Admitting: Family Medicine

## 2019-12-31 VITALS — BP 149/81 | HR 77 | Temp 98.3°F | Ht 61.0 in | Wt 154.0 lb

## 2019-12-31 DIAGNOSIS — R030 Elevated blood-pressure reading, without diagnosis of hypertension: Secondary | ICD-10-CM

## 2019-12-31 DIAGNOSIS — E669 Obesity, unspecified: Secondary | ICD-10-CM

## 2019-12-31 DIAGNOSIS — E7849 Other hyperlipidemia: Secondary | ICD-10-CM

## 2019-12-31 DIAGNOSIS — Z9189 Other specified personal risk factors, not elsewhere classified: Secondary | ICD-10-CM | POA: Diagnosis not present

## 2019-12-31 DIAGNOSIS — E8881 Metabolic syndrome: Secondary | ICD-10-CM

## 2019-12-31 DIAGNOSIS — R7303 Prediabetes: Secondary | ICD-10-CM

## 2019-12-31 DIAGNOSIS — Z6830 Body mass index (BMI) 30.0-30.9, adult: Secondary | ICD-10-CM

## 2019-12-31 NOTE — Progress Notes (Signed)
Chief Complaint:   OBESITY Melanie Washington is here to discuss her progress with her obesity treatment plan along with follow-up of her obesity related diagnoses. Melanie Washington is on the Category 2 Plan and states she is following her eating plan approximately 60% of the time. Melanie Washington states she has increased the amount of swimming she is doing.  Today's visit was #: 9 Starting weight: 173 lbs Starting date: 07/02/2019 Today's weight: 154 lbs Today's date: 12/31/2019 Total lbs lost to date: 21 lbs Total lbs lost since last in-office visit: 2 lbs Total weight loss percentage to date: -10.98  Interim History: Melanie Washington says she has been to a lot of birthday parties.  She will be going to [place] next week and will be going to [employer].  Denies hunger/cravings.  She says she drinks water if she is feeling hungry, and it usually works.  Assessment/Plan:   1. Prediabetes Melanie Washington has a diagnosis of prediabetes based on her elevated HgA1c and was informed this puts her at greater risk of developing diabetes.   Lab Results  Component Value Date   HGBA1C 6.1 (H) 10/09/2019   Lab Results  Component Value Date   INSULIN 14.0 07/02/2019   Not optimized. Goal is HgbA1c < 5.7 and insulin level closer to 5. The current medical regimen is effective;  continue present plan and medications.  2. Elevated blood pressure reading She says she is nervous today.  She was worried that she may have gained weight.  Will continue to monitor symptoms as they relate to her weight loss journey.  BP Readings from Last 3 Encounters:  12/31/19 (!) 149/81  12/02/19 125/77  11/06/19 133/78   3. Other hyperlipidemia [name] has hyperlipidemia and has been trying to improve her cholesterol levels with intensive lifestyle modification including a low saturated fat diet, exercise and weight loss. She denies any chest pain, claudication or myalgias.  Lab Results  Component Value Date   ALT 15 10/09/2019   AST 14 10/09/2019   ALKPHOS 127  (H) 10/09/2019   BILITOT 0.3 10/09/2019   Lab Results  Component Value Date   CHOL 211 (H) 10/09/2019   HDL 40 10/09/2019   LDLCALC 154 (H) 10/09/2019   TRIG 95 10/09/2019   CHOLHDL 5 02/07/2018   Not at goal. The current medical regimen is effective;  continue present plan. Labs to be rechecked soon.  4. Metabolic syndrome Melanie Washington has met her goal of losing 7-10% of her starting weight. We discussed several lifestyle modifications today and she will continue to work on diet, exercise and weight loss efforts.   5. At risk for heart disease Melanie Washington is at a higher than average risk for cardiovascular disease due to obesity, elevated blood pressure, prediabetes, hyperlipidemia (metabolic syndrome).   During insulin resistance, several metabolic alterations induce the development of cardiovascular disease. For instance, insulin resistance can induce an imbalance in glucose metabolism that generates chronic hyperglycemia, which in turn triggers oxidative stress and causes an inflammatory response that leads to cell damage. Insulin resistance can also alter systemic lipid metabolism which then leads to the development of dyslipidemia and the well-known lipid triad: (1) high levels of plasma triglycerides, (2) low levels of high-density lipoprotein, and (3) the appearance of small dense low-density lipoproteins. This triad, along with endothelial dysfunction, which can also be induced by aberrant insulin signaling, contribute to atherosclerotic plaque formation.   6. Class 1 obesity with serious comorbidity and body mass index (BMI) of 30.0 to 30.9 in adult,  unspecified obesity type Melanie Washington is currently in the action stage of change. As such, her goal is to continue with weight loss efforts. She has agreed to the Category 2 Plan.   Exercise goals: For substantial health benefits, adults should do at least 150 minutes (2 hours and 30 minutes) a week of moderate-intensity, or 75 minutes (1 hour and 15  minutes) a week of vigorous-intensity aerobic physical activity, or an equivalent combination of moderate- and vigorous-intensity aerobic activity. Aerobic activity should be performed in episodes of at least 10 minutes, and preferably, it should be spread throughout the week.  Behavioral modification strategies: travel eating strategies.  Melanie Washington has agreed to follow-up with our clinic in 3-4 weeks. She was informed of the importance of frequent follow-up visits to maximize her success with intensive lifestyle modifications for her multiple health conditions.   Objective:   Blood pressure (!) 149/81, pulse 77, temperature 98.3 F (36.8 C), temperature source Oral, height 5' 1"  (1.549 m), weight 154 lb (69.9 kg), SpO2 99 %. Body mass index is 29.1 kg/m.  General: Cooperative, alert, well developed, in no acute distress. HEENT: Conjunctivae and lids unremarkable. Cardiovascular: Regular rhythm.  Lungs: Normal work of breathing. Neurologic: No focal deficits.   Lab Results  Component Value Date   CREATININE 0.83 10/09/2019   BUN 14 10/09/2019   NA 143 10/09/2019   K 4.3 10/09/2019   CL 105 10/09/2019   CO2 24 10/09/2019   Lab Results  Component Value Date   ALT 15 10/09/2019   AST 14 10/09/2019   ALKPHOS 127 (H) 10/09/2019   BILITOT 0.3 10/09/2019   Lab Results  Component Value Date   HGBA1C 6.1 (H) 10/09/2019   HGBA1C 5.7 (H) 07/02/2019   Lab Results  Component Value Date   INSULIN 14.0 07/02/2019   Lab Results  Component Value Date   TSH 2.980 07/02/2019   Lab Results  Component Value Date   CHOL 211 (H) 10/09/2019   HDL 40 10/09/2019   LDLCALC 154 (H) 10/09/2019   TRIG 95 10/09/2019   CHOLHDL 5 02/07/2018   Lab Results  Component Value Date   WBC 6.3 10/09/2019   HGB 12.7 10/09/2019   HCT 39.4 10/09/2019   MCV 88 10/09/2019   PLT 321 10/09/2019   Lab Results  Component Value Date   IRON 59 07/02/2019   TIBC 396 07/02/2019   FERRITIN 15 07/02/2019    Attestation Statements:   Reviewed by clinician on day of visit: allergies, medications, problem list, medical history, surgical history, family history, social history, and previous encounter notes.  I, Water quality scientist, CMA, am acting as transcriptionist for Briscoe Deutscher, DO  I have reviewed the above documentation for accuracy and completeness, and I agree with the above. Briscoe Deutscher, DO

## 2020-01-14 ENCOUNTER — Other Ambulatory Visit: Payer: Self-pay | Admitting: Family Medicine

## 2020-01-16 ENCOUNTER — Other Ambulatory Visit: Payer: Self-pay | Admitting: Family Medicine

## 2020-01-16 DIAGNOSIS — F341 Dysthymic disorder: Secondary | ICD-10-CM

## 2020-01-21 ENCOUNTER — Other Ambulatory Visit: Payer: Self-pay

## 2020-01-21 ENCOUNTER — Ambulatory Visit (INDEPENDENT_AMBULATORY_CARE_PROVIDER_SITE_OTHER): Payer: 59 | Admitting: Family Medicine

## 2020-01-21 ENCOUNTER — Encounter (INDEPENDENT_AMBULATORY_CARE_PROVIDER_SITE_OTHER): Payer: Self-pay | Admitting: Family Medicine

## 2020-01-21 VITALS — BP 157/82 | HR 74 | Temp 97.9°F | Ht 61.0 in | Wt 156.0 lb

## 2020-01-21 DIAGNOSIS — R1013 Epigastric pain: Secondary | ICD-10-CM | POA: Diagnosis not present

## 2020-01-21 DIAGNOSIS — Z9189 Other specified personal risk factors, not elsewhere classified: Secondary | ICD-10-CM

## 2020-01-21 DIAGNOSIS — R7303 Prediabetes: Secondary | ICD-10-CM

## 2020-01-21 DIAGNOSIS — I158 Other secondary hypertension: Secondary | ICD-10-CM

## 2020-01-21 DIAGNOSIS — E66811 Obesity, class 1: Secondary | ICD-10-CM

## 2020-01-21 DIAGNOSIS — E7800 Pure hypercholesterolemia, unspecified: Secondary | ICD-10-CM

## 2020-01-21 DIAGNOSIS — F411 Generalized anxiety disorder: Secondary | ICD-10-CM | POA: Diagnosis not present

## 2020-01-21 DIAGNOSIS — Z6830 Body mass index (BMI) 30.0-30.9, adult: Secondary | ICD-10-CM

## 2020-01-21 DIAGNOSIS — E669 Obesity, unspecified: Secondary | ICD-10-CM

## 2020-01-21 NOTE — Progress Notes (Signed)
Chief Complaint:   OBESITY Melanie Washington is here to discuss her progress with her obesity treatment plan along with follow-up of her obesity related diagnoses. Melanie Washington is on the Category 2 Plan and states she is following her eating plan approximately 40% of the time. Melanie Washington states she is swimming 5 times per week.  Today's visit was #: 10 Starting weight: 173 lbs Starting date: 07/02/2019 Today's weight: 156 lbs Today's date: 01/21/2020 Total lbs lost to date: 19 lbs Total lbs lost since last in-office visit: 0 Total weight loss percentage to date: -9.83%  Interim History: Melanie Washington recently went to [employer].  She enjoyed herself and endorses eating lots of food but also doing lots of walking.  She is now back to work full-time.  Work is chaotic and overwhelming at times.  She spends her weekends with her grandson.  She is starting to enjoy the fall weather.  Melanie Washington is ready to get back on her plan.  Her bioimpedance results reveal that she is up 1 pound of water today.  She does endorse eating more salt and drinking less water.  Assessment/Plan:   1. Prediabetes Not at goal. Goal is HgbA1c < 5.7 and insulin level closer to 5. She will continue to focus on protein-rich, low simple carbohydrate foods. We reviewed the importance of hydration, regular exercise for stress reduction, and restorative sleep.   Lab Results  Component Value Date   HGBA1C 6.1 (H) 10/09/2019   Lab Results  Component Value Date   INSULIN 14.0 07/02/2019   2. Pure hypercholesterolemia  Lab Results  Component Value Date   CHOL 211 (H) 10/09/2019   HDL 40 10/09/2019   LDLCALC 154 (H) 10/09/2019   TRIG 95 10/09/2019   CHOLHDL 5 02/07/2018   Lab Results  Component Value Date   ALT 15 10/09/2019   AST 14 10/09/2019   ALKPHOS 127 (H) 10/09/2019   BILITOT 0.3 10/09/2019   The 10-year ASCVD risk score Denman George DC Jr., et al., 2013) is: 3.4%   Values used to calculate the score:     Age: 54 years     Sex: Female      Is Non-Hispanic African American: No     Diabetic: No     Tobacco smoker: No     Systolic Blood Pressure: 157 mmHg     Is BP treated: No     HDL Cholesterol: 40 mg/dL     Total Cholesterol: 211 mg/dL  Course: Not at goal.. Target levels for LDL are: < 130. Plan: Dietary changes: Increase soluble fiber. Decrease simple carbohydrates. Exercise changes: An average 40 minutes of moderate to vigorous-intensity aerobic activity 3 or 4 times per week. Lipid-lowering medications: Not indicated at this time.   3. GAD (generalized anxiety disorder), with emotional eating Doing well.  Controlled with Wellbutrin and as needed BuSpar.  She is also using nonfood related activities for decreasing emotional eating.  She continues to exercise regularly.  4. Dyspepsia, controlled with Omeprazole Controlled.  We will continue current medication.  5. Hypertension, with white coat syndrome, diet-controlled Labile blood pressures.Plan: Monitor home BP. Diet: Avoid buying foods that are: processed, frozen, or prepackaged to avoid excess salt. Recheck BP at next visit. The patient understands monitoring parameters and red flags.   6. At risk for diabetes mellitus Melanie Washington was given approximately 15 minutes of diabetes education and counseling today. We discussed intensive lifestyle modifications today with an emphasis on weight loss as well as increasing exercise and  decreasing simple carbohydrates in her diet. We also reviewed medication options with an emphasis on risk versus benefit of those discussed.   7. Overweight with body mass index (BMI) of 29 to 29.9 in adult  Melanie Washington is currently in the action stage of change. As such, her goal is to continue with weight loss efforts. She has agreed to the Category 2 Plan.   Exercise goals: For substantial health benefits, adults should do at least 150 minutes (2 hours and 30 minutes) a week of moderate-intensity, or 75 minutes (1 hour and 15 minutes) a week of  vigorous-intensity aerobic physical activity, or an equivalent combination of moderate- and vigorous-intensity aerobic activity. Aerobic activity should be performed in episodes of at least 10 minutes, and preferably, it should be spread throughout the week.  Behavioral modification strategies: increasing lean protein intake and increasing vegetables.  Melanie Washington has agreed to follow-up with our clinic in 3 weeks. She was informed of the importance of frequent follow-up visits to maximize her success with intensive lifestyle modifications for her multiple health conditions.   Objective:   Blood pressure (!) 157/82, pulse 74, temperature 97.9 F (36.6 C), temperature source Oral, height 5\' 1"  (1.549 m), weight 156 lb (70.8 kg), SpO2 100 %. Body mass index is 29.48 kg/m.  General: Cooperative, alert, well developed, in no acute distress. HEENT: Conjunctivae and lids unremarkable. Cardiovascular: Regular rhythm.  Lungs: Normal work of breathing. Neurologic: No focal deficits.   Lab Results  Component Value Date   CREATININE 0.83 10/09/2019   BUN 14 10/09/2019   NA 143 10/09/2019   K 4.3 10/09/2019   CL 105 10/09/2019   CO2 24 10/09/2019   Lab Results  Component Value Date   ALT 15 10/09/2019   AST 14 10/09/2019   ALKPHOS 127 (H) 10/09/2019   BILITOT 0.3 10/09/2019   Lab Results  Component Value Date   HGBA1C 6.1 (H) 10/09/2019   HGBA1C 5.7 (H) 07/02/2019   Lab Results  Component Value Date   INSULIN 14.0 07/02/2019   Lab Results  Component Value Date   TSH 2.980 07/02/2019   Lab Results  Component Value Date   CHOL 211 (H) 10/09/2019   HDL 40 10/09/2019   LDLCALC 154 (H) 10/09/2019   TRIG 95 10/09/2019   CHOLHDL 5 02/07/2018   Lab Results  Component Value Date   WBC 6.3 10/09/2019   HGB 12.7 10/09/2019   HCT 39.4 10/09/2019   MCV 88 10/09/2019   PLT 321 10/09/2019   Lab Results  Component Value Date   IRON 59 07/02/2019   TIBC 396 07/02/2019   FERRITIN 15  07/02/2019   Attestation Statements:   Reviewed by clinician on day of visit: allergies, medications, problem list, medical history, surgical history, family history, social history, and previous encounter notes.  I, 09/01/2019, CMA, am acting as transcriptionist for Insurance claims handler, DO  I have reviewed the above documentation for accuracy and completeness, and I agree with the above. Helane Rima, DO

## 2020-02-09 ENCOUNTER — Encounter (INDEPENDENT_AMBULATORY_CARE_PROVIDER_SITE_OTHER): Payer: Self-pay | Admitting: Family Medicine

## 2020-02-12 ENCOUNTER — Ambulatory Visit (INDEPENDENT_AMBULATORY_CARE_PROVIDER_SITE_OTHER): Payer: 59 | Admitting: Family Medicine

## 2020-04-23 ENCOUNTER — Ambulatory Visit: Payer: 59 | Admitting: Family Medicine

## 2020-04-23 ENCOUNTER — Encounter: Payer: Self-pay | Admitting: Family Medicine

## 2020-04-23 ENCOUNTER — Other Ambulatory Visit: Payer: Self-pay

## 2020-04-23 VITALS — BP 156/78 | HR 90 | Temp 97.9°F | Ht 61.0 in | Wt 157.4 lb

## 2020-04-23 DIAGNOSIS — F411 Generalized anxiety disorder: Secondary | ICD-10-CM

## 2020-04-23 DIAGNOSIS — I10 Essential (primary) hypertension: Secondary | ICD-10-CM | POA: Diagnosis not present

## 2020-04-23 DIAGNOSIS — R7303 Prediabetes: Secondary | ICD-10-CM | POA: Diagnosis not present

## 2020-04-23 DIAGNOSIS — Z23 Encounter for immunization: Secondary | ICD-10-CM | POA: Diagnosis not present

## 2020-04-23 LAB — POCT GLYCOSYLATED HEMOGLOBIN (HGB A1C): Hemoglobin A1C: 5.4 % (ref 4.0–5.6)

## 2020-04-23 MED ORDER — LISINOPRIL 10 MG PO TABS
10.0000 mg | ORAL_TABLET | Freq: Every day | ORAL | 0 refills | Status: DC
Start: 2020-04-23 — End: 2020-07-12

## 2020-04-23 MED ORDER — BUSPIRONE HCL 15 MG PO TABS: 15.0000 mg | ORAL_TABLET | Freq: Three times a day (TID) | ORAL | 1 refills | Status: DC

## 2020-04-23 NOTE — Progress Notes (Signed)
Patient: Melanie Washington MRN: [id number] DOB: November 10, 1965 PCP: Orland Mustard, MD     Subjective:  Chief Complaint  Patient presents with  . Prediabetes  . Anxiety    HPI: The patient is a 54 y.o. female who presents today for Pre DM, Anxiety. She is requesting flu vaccine.  Anxiety She is currently on wellbutrin 300mg  and buspar 7.5mg  BID. She states she is doing well on her medication, but her anxiety score is worse. She states work is likely the cause of her stress. She is not exercising. She doesn't have time to do this. She has continued to lose weight.    Prediabetes Due for a1c today.   Hypertension She has a long history of elevated blood pressure readings. She has a log with her today from December and nearly entire log is above goal. She is open to medication and I think it's time we start this.   Review of Systems  Constitutional: Negative for chills, fatigue and fever.  HENT: Negative for dental problem, ear pain, hearing loss and trouble swallowing.   Eyes: Negative for visual disturbance.  Respiratory: Negative for cough, chest tightness and shortness of breath.   Cardiovascular: Negative for chest pain, palpitations and leg swelling.  Gastrointestinal: Negative for abdominal pain, blood in stool, diarrhea and nausea.  Endocrine: Negative for cold intolerance, polydipsia, polyphagia and polyuria.  Genitourinary: Negative for dysuria and hematuria.  Musculoskeletal: Negative for arthralgias.  Skin: Negative for rash.  Neurological: Negative for dizziness, light-headedness and headaches.  Psychiatric/Behavioral: Negative for dysphoric mood and sleep disturbance. The patient is not nervous/anxious.     Allergies Patient has No Known Allergies.  Past Medical History Patient  has a past medical history of Anxiety, Back pain, Depression, Fear of other medical care, GERD (gastroesophageal reflux disease), Iron deficiency anemia, Joint pain, and Mixed  hyperlipidemia.  Surgical History Patient  has a past surgical history that includes Cesarean section.  Family History Pateint's family history includes Alcohol abuse in her maternal grandfather, paternal grandfather, and paternal grandmother; Asthma in her brother; COPD in her maternal grandmother and paternal grandmother; Cancer in her father and maternal grandfather; Depression in her brother; Diabetes in her father; Early death in her father and maternal grandfather; Hyperlipidemia in her father, maternal grandfather, and mother; Hypertension in her father and mother; Kidney disease in her father; Stroke in her father.  Social History Patient  reports that she has never smoked. She has never used smokeless tobacco. She reports previous alcohol use. She reports previous drug use.    Objective: Vitals:   04/23/20 0857  BP: (!) 156/78  Pulse: 90  Temp: 97.9 F (36.6 C)  TempSrc: Temporal  SpO2: 100%  Weight: 157 lb 6.4 oz (71.4 kg)  Height: 5\' 1"  (1.549 m)    Body mass index is 29.74 kg/m.  Physical Exam Vitals reviewed.  Constitutional:      Appearance: Normal appearance. She is obese.  HENT:     Head: Normocephalic and atraumatic.  Cardiovascular:     Rate and Rhythm: Normal rate and regular rhythm.     Heart sounds: Normal heart sounds.  Pulmonary:     Effort: Pulmonary effort is normal.     Breath sounds: Normal breath sounds.  Abdominal:     General: Bowel sounds are normal.     Palpations: Abdomen is soft.  Neurological:     General: No focal deficit present.     Mental Status: She is alert and oriented to person, place,  and time.  Psychiatric:        Mood and Affect: Mood normal.        Behavior: Behavior normal.    A1c: 5.4   GAD 7 : Generalized Anxiety Score 04/23/2020 10/23/2019 07/24/2019 05/10/2019  Nervous, Anxious, on Edge 1 1 1 1   Control/stop worrying 2 1 3 3   Worry too much - different things 2 1 2 3   Trouble relaxing 2 1 1 1   Restless 1 0 0 0   Easily annoyed or irritable 2 1 1  0  Afraid - awful might happen 2 0 0 1  Total GAD 7 Score 12 5 8 9   Anxiety Difficulty Not difficult at all - Not difficult at all Not difficult at all        Assessment/plan: 1. Need for immunization against influenza  - Flu Vaccine QUAD 36+ mos IM  2. GAD (generalized anxiety disorder) Gad 7 is significantly increased from last screen back in the summer. Work is a for her. We are going to increase her buspar to 15mg  Bid if needed. Encouraged her to exercise as well. F/u in 3 months   3. Prediabetes a1c significantly improved. So proud of her! Repeat in 6-12 months.  - POCT glycosylated hemoglobin (Hb A1C)  4. Essential hypertension Above goal on multiple occasions. Starting lisinopril 10mg /day. Side effects of ACE-I discussed including dry cough and angioedema. She is to call me if feels like they have a dry cough and they are to call 911 or go to ER if any signs/symptoms of angioedema. continue with her log. EKG done and in chart. Repeat labs in 3 months and will f/u on her log. Goal will be <130/80.     This visit occurred during the SARS-CoV-2 public health emergency.  Safety protocols were in place, including screening questions prior to the visit, additional usage of staff PPE, and extensive cleaning of exam room while observing appropriate contact time as indicated for disinfecting solutions.     Return in about 3 months (around 07/22/2020) for blood pressure .   , MD [place] [facility]   04/23/2020

## 2020-04-23 NOTE — Patient Instructions (Signed)
Prophylactic vitamins for covid 1) D3 2000IU/day 2) zinc: 25-30mg /day 3) vitamin C: 500mg /day 4) quercetin: 250-500mg /day  5) melatonin 6mg /night (drowsy)  I also want you to get a pulse ox off [employer] to have at the house and 1% iodine drops. If you get covid you will use these daily in nose.   Also mouth wash.   Call me right away if you get covid.    For blood pressure 1) we are going to start you on lisinopril 10mg /day. Common side effect is dry cough, let me know if this happens. Scariest side effect is swelling of the lips. If this happens go to ER. Very small percent, but I have to tell you!    For anxiety you can increase buspar to 15mg  twice a day if needed.

## 2020-05-08 ENCOUNTER — Encounter: Payer: Self-pay | Admitting: Family Medicine

## 2020-05-08 ENCOUNTER — Other Ambulatory Visit: Payer: Self-pay | Admitting: Family Medicine

## 2020-05-08 MED ORDER — BUSPIRONE HCL 15 MG PO TABS: 15.0000 mg | ORAL_TABLET | Freq: Two times a day (BID) | ORAL | 1 refills | Status: DC

## 2020-05-14 ENCOUNTER — Encounter: Payer: Self-pay | Admitting: Family Medicine

## 2020-05-15 ENCOUNTER — Telehealth (INDEPENDENT_AMBULATORY_CARE_PROVIDER_SITE_OTHER): Payer: 59 | Admitting: Physician Assistant

## 2020-05-15 ENCOUNTER — Encounter: Payer: Self-pay | Admitting: Physician Assistant

## 2020-05-15 VITALS — Ht 61.0 in | Wt 157.0 lb

## 2020-05-15 DIAGNOSIS — R220 Localized swelling, mass and lump, head: Secondary | ICD-10-CM | POA: Diagnosis not present

## 2020-05-15 MED ORDER — BACLOFEN 10 MG PO TABS: 10.0000 mg | ORAL_TABLET | Freq: Every evening | ORAL | 0 refills | Status: DC | PRN

## 2020-05-15 NOTE — Progress Notes (Signed)
Virtual Visit via Video   I connected with Melanie Washington on 05/15/20 at  4:00 PM EST by a video enabled telemedicine application and verified that I am speaking with the correct person using two identifiers. Location patient: Home Location provider: [facility], Office Persons participating in the virtual visit: Melanie Washington, Jarold Motto PA-C, Corky Mull, LPN   I discussed the limitations of evaluation and management by telemedicine and the availability of in person appointments. The patient expressed understanding and agreed to proceed.  I acted as a Neurosurgeon for [employer], PA-C [employer], LPN   Subjective:   HPI:    Swollen jaw Pt c/o left side of jaw swollen woke up this AM. Pt had COVID last week. Pt states she grinds her teeth at night and was having pain. Did buy mouth guard yesterday and wondering if she had a reaction to the mouth guard. Pt says pain is better with time. Has not tried anything for her symptoms.  Denies: fever, chills, inability to eat, decreased ROM of jaw, neck stiffness, chest pain   ROS: See pertinent positives and negatives per HPI.  Patient Active Problem List   Diagnosis Date Noted  . Dyspepsia, controlled with Omeprazole 01/21/2020  . Hypertension, with white coat syndrome, diet-controlled 01/21/2020  . Pure hypercholesterolemia 12/02/2019  . Elevated blood pressure reading 10/23/2019  . Prediabetes 07/24/2019  . Depression, major, single episode, moderate (HCC) 05/10/2019  . GAD (generalized anxiety disorder) 05/10/2019  . Gastroesophageal reflux disease without esophagitis 11/06/2017  . Mixed hyperlipidemia 11/06/2017  . Fear of medical care 11/06/2017  . Anxiety and depression 09/19/2015    Social History   Tobacco Use  . Smoking status: Never Smoker  . Smokeless tobacco: Never Used  Substance Use Topics  . Alcohol use: Not Currently    Current Outpatient Medications:  .  baclofen (LIORESAL) 10 MG tablet, Take  1 tablet (10 mg total) by mouth at bedtime as needed for muscle spasms., Disp: 30 each, Rfl: 0 .  buPROPion (WELLBUTRIN XL) 300 MG 24 hr tablet, TAKE ONE TABLET BY MOUTH DAILY, Disp: 90 tablet, Rfl: 1 .  busPIRone (BUSPAR) 15 MG tablet, Take 1 tablet (15 mg total) by mouth 2 (two) times daily., Disp: 90 tablet, Rfl: 1 .  fexofenadine (ALLEGRA) 60 MG tablet, Take by mouth., Disp: , Rfl:  .  lisinopril (ZESTRIL) 10 MG tablet, Take 1 tablet (10 mg total) by mouth daily., Disp: 90 tablet, Rfl: 0 .  omeprazole (PRILOSEC) 20 MG capsule, Take 1 capsule (20 mg total) by mouth daily., Disp: 90 capsule, Rfl: 3  No Known Allergies  Objective:   VITALS: Per patient if applicable, see vitals. GENERAL: Alert, appears well and in no acute distress. HEENT: Atraumatic, conjunctiva clear, no obvious abnormalities on inspection of external nose and ears. NECK: Normal movements of the head and neck. CARDIOPULMONARY: No increased WOB. Speaking in clear sentences. I:E ratio WNL.  MS: Moves all visible extremities without noticeable abnormality. Able to open mouth fully and move neck without obvious difficulty. PSYCH: Pleasant and cooperative, well-groomed. Speech normal rate and rhythm. Affect is appropriate. Insight and judgement are appropriate. Attention is focused, linear, and appropriate.  NEURO: CN grossly intact. Oriented as arrived to appointment on time with no prompting. Moves both UE equally.  SKIN: No obvious lesions, wounds, erythema, or cyanosis noted on face or hands. Mild swelling to L mandible region without obvious erythema  Assessment and Plan:   Melanie Washington was seen today  for facial swelling.  Diagnoses and all orders for this visit:  Jaw swelling  Other orders -     baclofen (LIORESAL) 10 MG tablet; Take 1 tablet (10 mg total) by mouth at bedtime as needed for muscle spasms.   No red flags on discussion. Possible TMJ and/or reaction to night guard. Stop night guard use. Trial OTC  anti-inflammatory ibuprofen for 2-3 days. May trial nightly baclofen to see if this helps her decrease tension in her jaw at night. Worsening precautions advised, follow-up with PCP if lack of improvement or other concerns.  I discussed the assessment and treatment plan with the patient. The patient was provided an opportunity to ask questions and all were answered. The patient agreed with the plan and demonstrated an understanding of the instructions.   The patient was advised to call back or seek an in-person evaluation if the symptoms worsen or if the condition fails to improve as anticipated.   CMA or LPN served as scribe during this visit. History, Physical, and Plan performed by medical provider. The above documentation has been reviewed and is accurate and complete.  [place], [place] 05/15/2020

## 2020-06-10 ENCOUNTER — Other Ambulatory Visit: Payer: Self-pay | Admitting: Physician Assistant

## 2020-07-06 ENCOUNTER — Other Ambulatory Visit: Payer: Self-pay | Admitting: Family Medicine

## 2020-07-06 DIAGNOSIS — K219 Gastro-esophageal reflux disease without esophagitis: Secondary | ICD-10-CM

## 2020-07-10 ENCOUNTER — Other Ambulatory Visit: Payer: Self-pay | Admitting: Family Medicine

## 2020-07-10 DIAGNOSIS — F341 Dysthymic disorder: Secondary | ICD-10-CM

## 2020-07-23 ENCOUNTER — Other Ambulatory Visit: Payer: Self-pay

## 2020-07-23 ENCOUNTER — Ambulatory Visit: Payer: 59 | Admitting: Family Medicine

## 2020-07-23 ENCOUNTER — Encounter: Payer: Self-pay | Admitting: Family Medicine

## 2020-07-23 VITALS — BP 150/82 | HR 88 | Temp 97.9°F | Ht 61.0 in | Wt 161.8 lb

## 2020-07-23 DIAGNOSIS — F419 Anxiety disorder, unspecified: Secondary | ICD-10-CM

## 2020-07-23 DIAGNOSIS — I158 Other secondary hypertension: Secondary | ICD-10-CM

## 2020-07-23 DIAGNOSIS — H6591 Unspecified nonsuppurative otitis media, right ear: Secondary | ICD-10-CM

## 2020-07-23 DIAGNOSIS — F32A Depression, unspecified: Secondary | ICD-10-CM | POA: Diagnosis not present

## 2020-07-23 MED ORDER — LISINOPRIL 20 MG PO TABS: 20.0000 mg | ORAL_TABLET | Freq: Every day | ORAL | 3 refills | Status: DC

## 2020-07-23 NOTE — Progress Notes (Signed)
Patient: Melanie Washington MRN: [id number] DOB: 02/16/1966 PCP: Orland Mustard, MD     Subjective:  Chief Complaint  Patient presents with  . Hypertension  . Ear Fullness    Right ear; wax inpacted  . Anxiety    HPI: The patient is a 55 y.o. female who presents today for routine  HTN visit. She complains today of right ear fullness, starting 1 weeks ago.  We started her on 10 mg of lisinopril back in December. She states her blood pressure is normal on the weekend and runs 120/80, but during the week it is 150/70-80. Her job is really stressful. She takes her medication at night. Denies any side effects including a dry cough. Takes daily as directed.   Anxiety I increased her buspar to 15mg  BID for her anxiety and she is happy with this dosage. She is 7.5mg  in the AM and then 15mg  at lunch. She is trying to exercise, but is watching a toddler a lot.   Review of Systems  Respiratory: Negative for chest tightness.   Cardiovascular: Negative for chest pain.  Neurological: Negative for dizziness and headaches.    Allergies Patient has No Known Allergies.  Past Medical History Patient  has a past medical history of Anxiety, Back pain, Depression, Fear of other medical care, GERD (gastroesophageal reflux disease), Iron deficiency anemia, Joint pain, and Mixed hyperlipidemia.  Surgical History Patient  has a past surgical history that includes Cesarean section.  Family History Pateint's family history includes Alcohol abuse in her maternal grandfather, paternal grandfather, and paternal grandmother; Asthma in her brother; COPD in her maternal grandmother and paternal grandmother; Cancer in her father and maternal grandfather; Depression in her brother; Diabetes in her father; Early death in her father and maternal grandfather; Hyperlipidemia in her father, maternal grandfather, and mother; Hypertension in her father and mother; Kidney disease in her father; Stroke in her father.  Social  History Patient  reports that she has never smoked. She has never used smokeless tobacco. She reports previous alcohol use. She reports previous drug use.    Objective: Vitals:   07/23/20 0757 07/23/20 0821  BP: (!) 142/90 (!) 150/82  Pulse: 88   Temp: 97.9 F (36.6 C)   TempSrc: Temporal   SpO2: 97%   Weight: 161 lb 12.8 oz (73.4 kg)   Height: 5\' 1"  (1.549 m)     Body mass index is 30.57 kg/m.  Physical Exam Vitals reviewed.  Constitutional:      Appearance: Normal appearance. She is well-developed. She is obese.  HENT:     Head: Normocephalic and atraumatic.     Right Ear: Ear canal and external ear normal.     Left Ear: Tympanic membrane, ear canal and external ear normal.     Ears:     Comments: Right TM pearly but with bulge in inferior aspect and fluid in superior aspect.     Nose: Nose normal.     Mouth/Throat:     Mouth: Mucous membranes are moist.  Eyes:     Conjunctiva/sclera: Conjunctivae normal.     Pupils: Pupils are equal, round, and reactive to light.  Neck:     Thyroid: No thyromegaly.  Cardiovascular:     Rate and Rhythm: Normal rate and regular rhythm.     Heart sounds: Normal heart sounds. No murmur heard.   Pulmonary:     Effort: Pulmonary effort is normal.     Breath sounds: Normal breath sounds.  Abdominal:  General: Bowel sounds are normal. There is no distension.     Palpations: Abdomen is soft.     Tenderness: There is no abdominal tenderness.  Musculoskeletal:     Cervical back: Normal range of motion and neck supple.  Lymphadenopathy:     Cervical: No cervical adenopathy.  Skin:    General: Skin is warm and dry.     Capillary Refill: Capillary refill takes less than 2 seconds.     Findings: No rash.  Neurological:     General: No focal deficit present.     Mental Status: She is alert and oriented to person, place, and time.     Cranial Nerves: No cranial nerve deficit.     Coordination: Coordination normal.     Deep Tendon  Reflexes: Reflexes normal.  Psychiatric:        Mood and Affect: Mood normal.        Behavior: Behavior normal.        Assessment/plan: 1. Anxiety and depression Doing well on increased buspar. Job is stressful for her, but she is coping well. F/u in 3 months with gad7.   2. essential hypertension  Above goal at home and here today. Increasing her linsiopril to 20mg /day. Continue with log. Stress is main source of this from her job. She has her annual in June and we can recheck at that time. If any light headedness, dizziness, fatigue she is to check her blood pressure and let me know. Will f/u in June with routine, fasting labs.  3. Fluid in right ear -no signs of infection at this point, does have fluid built up behind TM. Start flonase at night. If not better in 2 weeks she is to email me so I can refer to ENT.     This visit occurred during the SARS-CoV-2 public health emergency.  Safety protocols were in place, including screening questions prior to the visit, additional usage of staff PPE, and extensive cleaning of exam room while observing appropriate contact time as indicated for disinfecting solutions.     Return in about 3 months (around 10/22/2020) for annual/fasting labs and blood pressure check up .   10/24/2020, MD [place] [facility]   07/23/2020

## 2020-07-23 NOTE — Patient Instructions (Addendum)
1) for ear, start flonase nightly to see if this helps the ear drain. You have fluid build up and some bulging, but no infection. If not getting better, send me email so I can send to ENT.   2) blood pressure, increasing your lisinopril to 20mg /day. Make sure you continue with log to make sure I dont' drop your readings too much. Can f/u in June at annual.   Keep up the good work! Proud of you! Dr. July

## 2020-10-08 ENCOUNTER — Other Ambulatory Visit: Payer: Self-pay | Admitting: Family Medicine

## 2020-11-17 ENCOUNTER — Other Ambulatory Visit: Payer: Self-pay

## 2020-11-17 ENCOUNTER — Other Ambulatory Visit: Payer: Self-pay | Admitting: Family Medicine

## 2020-11-19 ENCOUNTER — Other Ambulatory Visit: Payer: Self-pay

## 2020-11-19 ENCOUNTER — Ambulatory Visit (INDEPENDENT_AMBULATORY_CARE_PROVIDER_SITE_OTHER): Payer: 59 | Admitting: Physician Assistant

## 2020-11-19 VITALS — BP 166/76 | HR 93 | Temp 97.0°F | Ht 61.0 in | Wt 167.2 lb

## 2020-11-19 DIAGNOSIS — F40232 Fear of other medical care: Secondary | ICD-10-CM

## 2020-11-19 DIAGNOSIS — I158 Other secondary hypertension: Secondary | ICD-10-CM

## 2020-11-19 DIAGNOSIS — Z0000 Encounter for general adult medical examination without abnormal findings: Secondary | ICD-10-CM

## 2020-11-19 DIAGNOSIS — K219 Gastro-esophageal reflux disease without esophagitis: Secondary | ICD-10-CM

## 2020-11-19 DIAGNOSIS — F419 Anxiety disorder, unspecified: Secondary | ICD-10-CM

## 2020-11-19 DIAGNOSIS — E782 Mixed hyperlipidemia: Secondary | ICD-10-CM | POA: Diagnosis not present

## 2020-11-19 DIAGNOSIS — F341 Dysthymic disorder: Secondary | ICD-10-CM

## 2020-11-19 DIAGNOSIS — R7303 Prediabetes: Secondary | ICD-10-CM | POA: Diagnosis not present

## 2020-11-19 DIAGNOSIS — F32A Depression, unspecified: Secondary | ICD-10-CM

## 2020-11-19 LAB — CBC WITH DIFFERENTIAL/PLATELET
Basophils Absolute: 0 10*3/uL (ref 0.0–0.1)
Basophils Relative: 0.7 % (ref 0.0–3.0)
Eosinophils Absolute: 0.1 10*3/uL (ref 0.0–0.7)
Eosinophils Relative: 1.9 % (ref 0.0–5.0)
HCT: 37.5 % (ref 36.0–46.0)
Hemoglobin: 12.5 g/dL (ref 12.0–15.0)
Lymphocytes Relative: 40.1 % (ref 12.0–46.0)
Lymphs Abs: 2.5 10*3/uL (ref 0.7–4.0)
MCHC: 33.4 g/dL (ref 30.0–36.0)
MCV: 88 fl (ref 78.0–100.0)
Monocytes Absolute: 0.5 10*3/uL (ref 0.1–1.0)
Monocytes Relative: 7.7 % (ref 3.0–12.0)
Neutro Abs: 3 10*3/uL (ref 1.4–7.7)
Neutrophils Relative %: 49.6 % (ref 43.0–77.0)
Platelets: 292 10*3/uL (ref 150.0–400.0)
RBC: 4.27 Mil/uL (ref 3.87–5.11)
RDW: 13.8 % (ref 11.5–15.5)
WBC: 6.1 10*3/uL (ref 4.0–10.5)

## 2020-11-19 LAB — COMPREHENSIVE METABOLIC PANEL
ALT: 26 U/L (ref 0–35)
AST: 23 U/L (ref 0–37)
Albumin: 4.3 g/dL (ref 3.5–5.2)
Alkaline Phosphatase: 101 U/L (ref 39–117)
BUN: 11 mg/dL (ref 6–23)
CO2: 27 mEq/L (ref 19–32)
Calcium: 9.4 mg/dL (ref 8.4–10.5)
Chloride: 103 mEq/L (ref 96–112)
Creatinine, Ser: 0.8 mg/dL (ref 0.40–1.20)
GFR: 84.39 mL/min (ref >60.00)
Glucose, Bld: 88 mg/dL (ref 70–99)
Potassium: 3.6 mEq/L (ref 3.5–5.1)
Sodium: 139 mEq/L (ref 135–145)
Total Bilirubin: 0.5 mg/dL (ref 0.2–1.2)
Total Protein: 8.1 g/dL (ref 6.0–8.3)

## 2020-11-19 LAB — LIPID PANEL
Cholesterol: 199 mg/dL (ref 0–200)
HDL: 37 mg/dL — ABNORMAL LOW (ref >39.00)
LDL Cholesterol: 143 mg/dL — ABNORMAL HIGH (ref 0–99)
NonHDL: 162.23
Total CHOL/HDL Ratio: 5
Triglycerides: 98 mg/dL (ref 0.0–149.0)
VLDL: 19.6 mg/dL (ref 0.0–40.0)

## 2020-11-19 LAB — HEMOGLOBIN A1C: Hgb A1c MFr Bld: 6.2 % (ref 4.6–6.5)

## 2020-11-19 MED ORDER — BUPROPION HCL ER (XL) 300 MG PO TB24
300.0000 mg | ORAL_TABLET | Freq: Every day | ORAL | 3 refills | Status: DC
Start: 2020-11-19 — End: 2021-11-26

## 2020-11-19 MED ORDER — LISINOPRIL 20 MG PO TABS
20.0000 mg | ORAL_TABLET | Freq: Every day | ORAL | 3 refills | Status: DC
Start: 2020-11-19 — End: 2022-03-11

## 2020-11-19 MED ORDER — OMEPRAZOLE 20 MG PO CPDR
20.0000 mg | DELAYED_RELEASE_CAPSULE | Freq: Every day | ORAL | 3 refills | Status: DC
Start: 2020-11-19 — End: 2022-02-03

## 2020-11-19 MED ORDER — BUSPIRONE HCL 15 MG PO TABS
15.0000 mg | ORAL_TABLET | Freq: Three times a day (TID) | ORAL | 3 refills | Status: AC
Start: 2020-11-19 — End: 2021-02-17

## 2020-11-19 NOTE — Progress Notes (Signed)
Established Patient Office Visit  Subjective:  Patient ID: Melanie Washington, female    DOB: [birth year]  Age: 55 y.o. MRN: [id number]  CC:  Chief Complaint  Patient presents with   Annual Exam    HPI Melanie Washington presents for annual CPE.  She is working full-time, which does bring on some stress, and also cares for her 53-year-old grandson.  Acute concerns: She is wondering about increasing her buspirone as she is still experiencing anxiety throughout the day.  Some days are worse than others and she feels like she needs a third dose sometimes.  Otherwise she feels like the Wellbutrin and BuSpar combination is working really well for her anxiety.  Health maintenance: Patient has a significant history of fear of medical care.  It sounds like it started as a child after her bladder had to be stretched at a young age.  She likes to keep preventive care to complete minimal.  She takes her medications as directed.  She is not interested in any additional care such as Pap smears, colon cancer screening, mammograms, or vaccines at this time.    Past Medical History:  Diagnosis Date   Anxiety    Back pain    Depression    Fear of other medical care    GERD (gastroesophageal reflux disease)    Iron deficiency anemia    Joint pain    Mixed hyperlipidemia     Past Surgical History:  Procedure Laterality Date   CESAREAN SECTION      Family History  Problem Relation Age of Onset   Hyperlipidemia Mother    Hypertension Mother    Cancer Father    Early death Father    Hyperlipidemia Father    Hypertension Father    Stroke Father    Diabetes Father    Kidney disease Father    Asthma Brother    Depression Brother        Died by suicide at age 8   COPD Maternal Grandmother    Alcohol abuse Maternal Grandfather    Cancer Maternal Grandfather    Early death Maternal Grandfather    Hyperlipidemia Maternal Grandfather    Alcohol abuse Paternal Grandmother    COPD Paternal  Grandmother    Alcohol abuse Paternal Grandfather     Social History   Socioeconomic History   Marital status: Married    Spouse name: Not on file   Number of children: Not on file   Years of education: Not on file   Highest education level: Not on file  Occupational History   Occupation: RECEPTION    Employer: [employer]  Tobacco Use   Smoking status: Never   Smokeless tobacco: Never  Substance and Sexual Activity   Alcohol use: Not Currently   Drug use: Not Currently   Sexual activity: Yes  Other Topics Concern   Not on file  Social History Narrative   Teased after her brother's friend saw her naked when she was a teen. Goes to [employer]. Therapy not helpful. Brother died by suicide age 12. Fear of medical care.   Social Determinants of Health   Financial Resource Strain: Not on file  Food Insecurity: Not on file  Transportation Needs: Not on file  Physical Activity: Not on file  Stress: Not on file  Social Connections: Not on file  Intimate Partner Violence: Not on file    Outpatient Medications Prior to Visit  Medication Sig Dispense Refill   buPROPion (WELLBUTRIN XL)  300 MG 24 hr tablet TAKE ONE TABLET BY MOUTH DAILY 90 tablet 1   busPIRone (BUSPAR) 15 MG tablet TAKE ONE TABLET BY MOUTH TWICE A DAY 90 tablet 0   fexofenadine (ALLEGRA) 60 MG tablet Take by mouth.     lisinopril (ZESTRIL) 20 MG tablet Take 1 tablet (20 mg total) by mouth daily. 90 tablet 3   omeprazole (PRILOSEC) 20 MG capsule TAKE ONE CAPSULE BY MOUTH DAILY 90 capsule 3   baclofen (LIORESAL) 10 MG tablet Take 1 tablet (10 mg total) by mouth at bedtime as needed for muscle spasms. 30 each 0   lisinopril (ZESTRIL) 10 MG tablet TAKE ONE TABLET BY MOUTH DAILY 90 tablet 0   No facility-administered medications prior to visit.    No Known Allergies  ROS Review of Systems  Constitutional:  Negative for activity change, appetite change, fever and unexpected weight change.  HENT:  Negative for  congestion.   Eyes:  Negative for visual disturbance.  Respiratory:  Negative for apnea, cough and shortness of breath.   Cardiovascular:  Negative for chest pain, palpitations and leg swelling.  Gastrointestinal:  Negative for abdominal pain, blood in stool, constipation and diarrhea.  Endocrine: Negative for polydipsia, polyphagia and polyuria.  Genitourinary:  Negative for dysuria and pelvic pain.  Musculoskeletal:  Negative for arthralgias.  Skin:  Negative for rash.  Neurological:  Negative for dizziness, weakness and headaches.  Hematological:  Negative for adenopathy. Does not bruise/bleed easily.  Psychiatric/Behavioral:  Negative for sleep disturbance and suicidal ideas. The patient is nervous/anxious.      Objective:    Physical Exam Vitals and nursing note reviewed.  Constitutional:      General: She is not in acute distress.    Appearance: Normal appearance. She is normal weight.  HENT:     Head: Normocephalic.     Right Ear: External ear normal.     Left Ear: External ear normal.     Nose: Nose normal.     Mouth/Throat:     Mouth: Mucous membranes are moist.  Eyes:     Extraocular Movements: Extraocular movements intact.     Conjunctiva/sclera: Conjunctivae normal.     Pupils: Pupils are equal, round, and reactive to light.  Cardiovascular:     Rate and Rhythm: Normal rate and regular rhythm.     Pulses: Normal pulses.     Heart sounds: No murmur heard. Pulmonary:     Effort: Pulmonary effort is normal.     Breath sounds: Normal breath sounds.  Abdominal:     Tenderness: There is no abdominal tenderness.  Musculoskeletal:        General: Normal range of motion.     Cervical back: Normal range of motion.  Skin:    General: Skin is warm.  Neurological:     General: No focal deficit present.     Mental Status: She is alert and oriented to person, place, and time.     Gait: Gait normal.  Psychiatric:     Comments: Very anxious but improves as we talk  through the visit    BP (!) 166/76   Pulse 93   Temp (!) 97 F (36.1 C)   Ht 5\' 1"  (1.549 m)   Wt 167 lb 3.2 oz (75.8 kg)   SpO2 99%   BMI 31.59 kg/m  Wt Readings from Last 3 Encounters:  11/19/20 167 lb 3.2 oz (75.8 kg)  07/23/20 161 lb 12.8 oz (73.4 kg)  05/15/20 157 lb (  71.2 kg)     There are no preventive care reminders to display for this patient.  There are no preventive care reminders to display for this patient.  Lab Results  Component Value Date   TSH 2.980 07/02/2019   Lab Results  Component Value Date   WBC 6.3 10/09/2019   HGB 12.7 10/09/2019   HCT 39.4 10/09/2019   MCV 88 10/09/2019   PLT 321 10/09/2019   Lab Results  Component Value Date   NA 143 10/09/2019   K 4.3 10/09/2019   CO2 24 10/09/2019   GLUCOSE 94 10/09/2019   BUN 14 10/09/2019   CREATININE 0.83 10/09/2019   BILITOT 0.3 10/09/2019   ALKPHOS 127 (H) 10/09/2019   AST 14 10/09/2019   ALT 15 10/09/2019   PROT 7.8 10/09/2019   ALBUMIN 4.3 10/09/2019   CALCIUM 9.4 10/09/2019   GFR 84.28 02/07/2018   Lab Results  Component Value Date   CHOL 211 (H) 10/09/2019   Lab Results  Component Value Date   HDL 40 10/09/2019   Lab Results  Component Value Date   LDLCALC 154 (H) 10/09/2019   Lab Results  Component Value Date   TRIG 95 10/09/2019   Lab Results  Component Value Date   CHOLHDL 5 02/07/2018   Lab Results  Component Value Date   HGBA1C 5.4 04/23/2020      Assessment & Plan:   Problem List Items Addressed This Visit   None   No orders of the defined types were placed in this encounter.   Follow-up: No follow-ups on file.   1. Encounter for annual physical exam Age-appropriate screening and counseling performed today. Will check labs and call with results. Preventive measures discussed and printed in AVS for patient.    2. Prediabetes Last check in December was normal.  As long as hemoglobin A1c is still normal today I am okay with checking this only once a  year.  3. Essential hypertension  Elevated today.  She is very nervous about her appointment.  She will continue to monitor at home.  She is taking lisinopril 20 mg daily.  4. Mixed hyperlipidemia Labs today.  Encouraged healthy eating and exercise.  5. Fear of medical care 6. Anxiety and depression 7. Dysthymia She is stable on the Wellbutrin 300 mg once daily.  I refilled this for the next year for her.  We also discussed the BuSpar and I am okay with her increasing to 15 mg 3 times daily as she needs, as her anxiety is feeling worse.  Refilled this for the year for her as well. GAD 7 : Generalized Anxiety Score 04/23/2020 10/23/2019 07/24/2019 05/10/2019  Nervous, Anxious, on Edge 1 1 1 1   Control/stop worrying 2 1 3 3   Worry too much - different things 2 1 2 3   Trouble relaxing 2 1 1 1   Restless 1 0 0 0  Easily annoyed or irritable 2 1 1  0  Afraid - awful might happen 2 0 0 1  Total GAD 7 Score 12 5 8 9   Anxiety Difficulty Not difficult at all - Not difficult at all Not difficult at all     8. Gastroesophageal reflux disease without esophagitis She is stable on omeprazole 20 mg daily.  This note was prepared with assistance of . Occasional wrong-word or sound-a-like substitutions may have occurred due to the inherent limitations of voice recognition software.     Dasan Hardman M Levelle Edelen, PA-C

## 2020-11-19 NOTE — Patient Instructions (Addendum)
Good to meet you today! Please go to the lab for blood work and I will send results through MyChart. Keep monitoring BP Call if any concerns!

## 2021-02-01 ENCOUNTER — Encounter: Payer: 59 | Admitting: Physician Assistant

## 2021-11-09 ENCOUNTER — Ambulatory Visit: Payer: 59 | Admitting: Physician Assistant

## 2021-11-09 ENCOUNTER — Encounter: Payer: Self-pay | Admitting: Physician Assistant

## 2021-11-09 VITALS — BP 176/94 | HR 75 | Temp 97.3°F | Resp 16 | Ht 60.0 in | Wt 169.8 lb

## 2021-11-09 DIAGNOSIS — F32A Depression, unspecified: Secondary | ICD-10-CM | POA: Diagnosis not present

## 2021-11-09 DIAGNOSIS — F419 Anxiety disorder, unspecified: Secondary | ICD-10-CM | POA: Diagnosis not present

## 2021-11-09 MED ORDER — VORTIOXETINE HBR 10 MG PO TABS: 10.0000 mg | ORAL_TABLET | Freq: Every day | ORAL | 0 refills | Status: DC

## 2021-11-09 NOTE — Progress Notes (Signed)
Subjective:    Patient ID: Melanie Washington, female    DOB: 09/22/1965, 56 y.o.   MRN: [id number]  Chief Complaint  Patient presents with   Depression    Depression has been "slowly going down hil" for 6 months     HPI Patient is in today for follow-up on depression. Feels like things have been going downhill for the last 6 months. 23 when she started treatment for depression. Very tearful, fearing like nobody likes her or wants her around. No suicidal thoughts or actions at all.  Husband is her biggest support. Feels best when he is around.  Still taking Wellbutrin XL 300 mg daily and buspar 15 mg TID.  Previously did well with Trintellix as well and wants to start back on this.   Paxil previously worked very well for her, but she gained 50 lbs on it.   Depression runs in family. Father's aunt and also her brother committed suicide. Daughter is on antidepressants as well.  Past Medical History:  Diagnosis Date   Anxiety    Back pain    Depression    Fear of other medical care    GERD (gastroesophageal reflux disease)    Iron deficiency anemia    Joint pain    Mixed hyperlipidemia     Past Surgical History:  Procedure Laterality Date   CESAREAN SECTION      Family History  Problem Relation Age of Onset   Hyperlipidemia Mother    Hypertension Mother    Cancer Father    Early death Father    Hyperlipidemia Father    Hypertension Father    Stroke Father    Diabetes Father    Kidney disease Father    Asthma Brother    Depression Brother        Died by suicide at age 46   COPD Maternal Grandmother    Alcohol abuse Maternal Grandfather    Cancer Maternal Grandfather    Early death Maternal Grandfather    Hyperlipidemia Maternal Grandfather    Alcohol abuse Paternal Grandmother    COPD Paternal Grandmother    Alcohol abuse Paternal Grandfather     Social History   Tobacco Use   Smoking status: Never   Smokeless tobacco: Never  Substance Use Topics    Alcohol use: Not Currently   Drug use: Not Currently     No Known Allergies  Review of Systems NEGATIVE UNLESS OTHERWISE INDICATED IN HPI      Objective:     BP (!) 176/94   Pulse 75   Temp (!) 97.3 F (36.3 C) (Temporal)   Resp 16   Ht 5' (1.524 m)   Wt 169 lb 12.8 oz (77 kg)   SpO2 100%   BMI 33.16 kg/m   Wt Readings from Last 3 Encounters:  11/09/21 169 lb 12.8 oz (77 kg)  11/19/20 167 lb 3.2 oz (75.8 kg)  07/23/20 161 lb 12.8 oz (73.4 kg)    BP Readings from Last 3 Encounters:  11/09/21 (!) 176/94  11/19/20 (!) 166/76  07/23/20 (!) 150/82     Physical Exam Constitutional:      Appearance: Normal appearance. She is obese.  Cardiovascular:     Rate and Rhythm: Normal rate and regular rhythm.     Pulses: Normal pulses.     Heart sounds: Normal heart sounds. No murmur heard. Pulmonary:     Effort: Pulmonary effort is normal.     Breath sounds: Normal breath sounds.  Neurological:     General: No focal deficit present.     Mental Status: She is alert and oriented to person, place, and time.  Psychiatric:        Mood and Affect: Affect is tearful.        Behavior: Behavior normal.        Thought Content: Thought content normal.        Assessment & Plan:   Problem List Items Addressed This Visit       Other   Anxiety and depression - Primary   Relevant Medications   busPIRone (BUSPAR) 15 MG tablet   vortioxetine HBr (TRINTELLIX) 10 MG TABS tablet     Meds ordered this encounter  Medications   vortioxetine HBr (TRINTELLIX) 10 MG TABS tablet    Sig: Take 1 tablet (10 mg total) by mouth daily.    Dispense:  30 tablet    Refill:  0    Order Specific Question:   Supervising Provider    Answer:   Durene Cal, STEPHEN O [[id number]]   1. Anxiety and depression Mostly major depression Continue on Wellbutrin XL 300 mg and Buspar 15 mg TID Add Trintellix 5-10 mg daily; caution nausea Counseling advised Natural remedies advised (walking, sunshine,  journaling, etc)  ED / [facility] if any SI at all    Return in about 2 weeks (around 11/23/2021) for follow up .    Dexter Sauser M Givanni Staron, PA-C

## 2021-11-09 NOTE — Patient Instructions (Signed)
Cont current medication regimen Add Trintellix 5-10 mg back on - if we cannot get this approved, plan to start back on Prozac   If you develop suicidal thoughts, please tell someone and immediately proceed to our local 24/7 crisis center, [facility].[address], [place], [place] [phone].

## 2021-11-10 ENCOUNTER — Telehealth: Payer: Self-pay | Admitting: Physician Assistant

## 2021-11-10 ENCOUNTER — Other Ambulatory Visit: Payer: Self-pay | Admitting: Physician Assistant

## 2021-11-10 NOTE — Telephone Encounter (Signed)
Pt states: -insurance will not cover recent medication.  -alternative prescription was discussed in recent appointment.  Pt requests vortioxetine HBr (TRINTELLIX) 10 MG TABS tablet [[id number]] to be cancelled.  Pt requests Prozac to be sent to pharmacy.  Pharmacy: [facility] [id number] - [address] [place] - [address]  [address] [place] [address]  Phone:  [phone]  Fax:  [phone]

## 2021-11-11 NOTE — Telephone Encounter (Signed)
Prior Auth Pending submitted 11/11/21 [email or web address]: [id number]

## 2021-11-15 NOTE — Telephone Encounter (Signed)
Caller states wife has still not received her prozac and needs it as soon as possible. States that his wife has voiced she does not want to continue with the trintellix, but would like to switch to prozac. I informed caller of PA pending and recommended they check in between 24-48 hours. Caller verbalized understanding.

## 2021-11-16 ENCOUNTER — Other Ambulatory Visit: Payer: Self-pay | Admitting: Physician Assistant

## 2021-11-16 MED ORDER — FLUOXETINE HCL 20 MG PO CAPS: 20.0000 mg | ORAL_CAPSULE | Freq: Every morning | ORAL | 0 refills | Status: DC

## 2021-11-16 NOTE — Telephone Encounter (Signed)
Not showing Fluoxetine in active medication lists; please advise as Trintellix PA is still pending but pt not wanting to continue that medication.

## 2021-11-16 NOTE — Telephone Encounter (Signed)
Patient advised Rx sent to pharmacy and has a F/u appt scheduled to discuss medication and how things are going. Pt verbalized understanding of reommendations.

## 2021-11-26 ENCOUNTER — Encounter: Payer: Self-pay | Admitting: Physician Assistant

## 2021-11-26 ENCOUNTER — Telehealth (INDEPENDENT_AMBULATORY_CARE_PROVIDER_SITE_OTHER): Payer: 59 | Admitting: Physician Assistant

## 2021-11-26 VITALS — Ht 60.0 in | Wt 169.0 lb

## 2021-11-26 DIAGNOSIS — F32A Depression, unspecified: Secondary | ICD-10-CM

## 2021-11-26 DIAGNOSIS — Z1211 Encounter for screening for malignant neoplasm of colon: Secondary | ICD-10-CM | POA: Diagnosis not present

## 2021-11-26 DIAGNOSIS — F419 Anxiety disorder, unspecified: Secondary | ICD-10-CM | POA: Diagnosis not present

## 2021-11-26 DIAGNOSIS — F341 Dysthymic disorder: Secondary | ICD-10-CM | POA: Diagnosis not present

## 2021-11-26 MED ORDER — BUSPIRONE HCL 15 MG PO TABS: 15.0000 mg | ORAL_TABLET | Freq: Three times a day (TID) | ORAL | 1 refills | Status: DC

## 2021-11-26 MED ORDER — FLUOXETINE HCL 20 MG PO CAPS: 20.0000 mg | ORAL_CAPSULE | Freq: Every morning | ORAL | 3 refills | Status: DC

## 2021-11-26 MED ORDER — BUPROPION HCL ER (XL) 300 MG PO TB24: 300.0000 mg | ORAL_TABLET | Freq: Every day | ORAL | 3 refills | Status: DC

## 2021-11-26 NOTE — Progress Notes (Signed)
   Virtual Visit via Video Note  I connected with  Patsi Sears  on 11/26/21 at 10:00 AM EDT by a video enabled telemedicine application and verified that I am speaking with the correct person using two identifiers.  Location: Patient: work Restaurant manager, fast food: Nature conservation officer at [employer] Persons present: Patient and myself   I discussed the limitations of evaluation and management by telemedicine and the availability of in person appointments. The patient expressed understanding and agreed to proceed.   History of Present Illness:  56 yo female presents for VV recheck on anxiety and depression from last visit.  "The medicine [Prozac] has done wonders." Says she's no longer crying all the time. Feels more energized. Not over-thinking as much. Sleep - goes to bed around 9:30 pm, gets up around 2:30 am.  Occasionally takes Z-quill to get a few more hours. Needing refills on Buspar, Wellbutrin, Prozac.  Due for cologuard screening. Severe medical-office anxiety, declines colonoscopy. Low-risk for colon cancer.    Observations/Objective:   Gen: Awake, alert, no acute distress Resp: Breathing is even and non-labored Psych: calm/pleasant demeanor Neuro: Alert and Oriented x 3, + facial symmetry, speech is clear.   Assessment and Plan:  1. Anxiety and depression 2. Dysthymia Great improvement with addition of Prozac 20 mg. Refilled this. Will cont on Wellbutrin XL 300 mg qd and Buspar 15 mg TID. Keep working on natural ways to reduce stress and anxiety.  3. Colon cancer screening Cologuard ordered for low-risk patient with severe medical anxiety.   Follow Up Instructions:    I discussed the assessment and treatment plan with the patient. The patient was provided an opportunity to ask questions and all were answered. The patient agreed with the plan and demonstrated an understanding of the instructions.   The patient was advised to call back or seek an in-person evaluation  if the symptoms worsen or if the condition fails to improve as anticipated.  Marrissa Dai M Harleyquinn Gasser, PA-C

## 2021-12-01 ENCOUNTER — Encounter (INDEPENDENT_AMBULATORY_CARE_PROVIDER_SITE_OTHER): Payer: Self-pay

## 2021-12-03 ENCOUNTER — Encounter: Payer: Self-pay | Admitting: Physician Assistant

## 2021-12-03 ENCOUNTER — Ambulatory Visit (INDEPENDENT_AMBULATORY_CARE_PROVIDER_SITE_OTHER): Payer: 59 | Admitting: Physician Assistant

## 2021-12-03 VITALS — BP 152/80 | HR 85 | Temp 98.1°F | Ht 60.0 in | Wt 169.0 lb

## 2021-12-03 DIAGNOSIS — Z0000 Encounter for general adult medical examination without abnormal findings: Secondary | ICD-10-CM | POA: Diagnosis not present

## 2021-12-03 DIAGNOSIS — E782 Mixed hyperlipidemia: Secondary | ICD-10-CM

## 2021-12-03 DIAGNOSIS — I158 Other secondary hypertension: Secondary | ICD-10-CM

## 2021-12-03 DIAGNOSIS — F32A Depression, unspecified: Secondary | ICD-10-CM

## 2021-12-03 DIAGNOSIS — R7303 Prediabetes: Secondary | ICD-10-CM | POA: Diagnosis not present

## 2021-12-03 DIAGNOSIS — F419 Anxiety disorder, unspecified: Secondary | ICD-10-CM

## 2021-12-03 NOTE — Patient Instructions (Addendum)
Good to see you again today!   Please schedule for fasting labs - lab only appointment. So sorry lab isn't open today!  Next med check / follow-up with me in 6 months.    PLEASE NOTE:  If you had any LAB tests please let us know if you have not heard back within a few days. You may see your results on MyChart before we have a chance to review them but we will give you a call once they are reviewed by [place]. If we ordered any REFERRALS today, please let us know if you have not heard from their office within the next two weeks. Let us know through MyChart if you are needing REFILLS, or have your pharmacy send [place] the request. You can also use MyChart to communicate with me or any office staff.  Please try these tips to maintain a healthy lifestyle:  Eat most of your calories during the day when you are active. Eliminate processed foods including packaged sweets (pies, cakes, cookies), reduce intake of potatoes, white bread, white pasta, and white rice. Look for whole grain options, oat flour or almond flour.  Each meal should contain half fruits/vegetables, one quarter protein, and one quarter carbs (no bigger than a computer mouse).  Cut down on sweet beverages. This includes juice, soda, and sweet tea. Also watch fruit intake, though this is a healthier sweet option, it still contains natural sugar! Limit to 3 servings daily.  Drink at least 1 glass of water with each meal and aim for at least 8 glasses (64 ounces) per day.  Exercise at least 150 minutes every week to the best of your ability.    Take Care,  Yash Cacciola, PA-C

## 2021-12-03 NOTE — Progress Notes (Signed)
Subjective:    Patient ID: Melanie Washington, female    DOB: [birth year], 56 y.o.   MRN: [id number]  HPI Patient is in today for annual exam.  Acute concerns: No acute health concerns   Health maintenance: Lifestyle/ exercise: Walking all day at work  Nutrition: Breakfast foods are enjoyed the most; fairly well-balanced Mental health: Prozac and Wellbutrin working well  Caffeine: None Sleep: Still not sleeping well; usually up around 2 or 3 am and up for the day  Substance use: None ETOH: None  Sexual activity: Monogamous with husband Not UTD with health maintenance.    Past Medical History:  Diagnosis Date   Anxiety    Back pain    Depression    Fear of other medical care    GERD (gastroesophageal reflux disease)    Iron deficiency anemia    Joint pain    Mixed hyperlipidemia     Past Surgical History:  Procedure Laterality Date   CESAREAN SECTION      Family History  Problem Relation Age of Onset   Hyperlipidemia Mother    Hypertension Mother    Cancer Father    Early death Father    Hyperlipidemia Father    Hypertension Father    Stroke Father    Diabetes Father    Kidney disease Father    Asthma Brother    Depression Brother        Died by suicide at age 42   COPD Maternal Grandmother    Alcohol abuse Maternal Grandfather    Cancer Maternal Grandfather    Early death Maternal Grandfather    Hyperlipidemia Maternal Grandfather    Alcohol abuse Paternal Grandmother    COPD Paternal Grandmother    Alcohol abuse Paternal Grandfather     Social History   Tobacco Use   Smoking status: Never   Smokeless tobacco: Never  Substance Use Topics   Alcohol use: Not Currently   Drug use: Not Currently     No Known Allergies  Review of Systems NEGATIVE UNLESS OTHERWISE INDICATED IN HPI      Objective:     BP (!) 152/80   Pulse 85   Temp 98.1 F (36.7 C) (Oral)   Ht 5' (1.524 m)   Wt 169 lb (76.7 kg)   SpO2 98%   BMI 33.01 kg/m   Wt  Readings from Last 3 Encounters:  12/03/21 169 lb (76.7 kg)  11/26/21 169 lb (76.7 kg)  11/09/21 169 lb 12.8 oz (77 kg)    BP Readings from Last 3 Encounters:  12/03/21 (!) 152/80  11/09/21 (!) 176/94  11/19/20 (!) 166/76     Physical Exam Vitals and nursing note reviewed.  Constitutional:      Appearance: Normal appearance. She is obese. She is not toxic-appearing.  HENT:     Head: Normocephalic and atraumatic.     Right Ear: Tympanic membrane, ear canal and external ear normal.     Left Ear: Tympanic membrane, ear canal and external ear normal.     Nose: Nose normal.     Mouth/Throat:     Mouth: Mucous membranes are moist.  Eyes:     Extraocular Movements: Extraocular movements intact.     Conjunctiva/sclera: Conjunctivae normal.     Pupils: Pupils are equal, round, and reactive to light.  Cardiovascular:     Rate and Rhythm: Normal rate and regular rhythm.     Pulses: Normal pulses.     Heart sounds: Normal heart  sounds.  Pulmonary:     Effort: Pulmonary effort is normal.     Breath sounds: Normal breath sounds.  Abdominal:     General: Abdomen is flat. Bowel sounds are normal.     Palpations: Abdomen is soft.  Musculoskeletal:        General: Normal range of motion.     Cervical back: Normal range of motion and neck supple.  Skin:    General: Skin is warm and dry.  Neurological:     General: No focal deficit present.     Mental Status: She is alert and oriented to person, place, and time.  Psychiatric:        Mood and Affect: Mood normal.        Behavior: Behavior normal.        Thought Content: Thought content normal.        Judgment: Judgment normal.        Assessment & Plan:   Problem List Items Addressed This Visit       Cardiovascular and Mediastinum   essential hypertension      Other   Anxiety and depression   Mixed hyperlipidemia   Relevant Orders   Lipid panel   Prediabetes   Relevant Orders   Comprehensive metabolic panel    Hemoglobin A1c   Other Visit Diagnoses     Encounter for annual physical exam    -  Primary   Relevant Orders   CBC with Differential/Platelet   Comprehensive metabolic panel   Lipid panel   Hemoglobin A1c       Plan: Age-appropriate screening and counseling performed today. Will check labs and call with results. Preventive measures discussed and printed in AVS for patient. She still declines health maintenance items such as pap smear, mammo, cologuard, due to severe medical anxiety. She will let me know if she changes her mind about this in the future. Aware of risks not having regular screenings.  BP is elevated today - hx WCS. Cont to monitor at home. Cont Lisinopril 20 mg.  Mental health is doing much better still. Will cont Fluoxetine 20 mg and Wellbutrin XL 300 mg.    Return in about 6 months (around 06/05/2022) for med recheck .    Joli Koob M Malan Werk, PA-C

## 2021-12-07 ENCOUNTER — Other Ambulatory Visit (INDEPENDENT_AMBULATORY_CARE_PROVIDER_SITE_OTHER): Payer: 59

## 2021-12-07 DIAGNOSIS — R7303 Prediabetes: Secondary | ICD-10-CM

## 2021-12-07 DIAGNOSIS — E782 Mixed hyperlipidemia: Secondary | ICD-10-CM

## 2021-12-07 DIAGNOSIS — Z0000 Encounter for general adult medical examination without abnormal findings: Secondary | ICD-10-CM | POA: Diagnosis not present

## 2021-12-07 LAB — COMPREHENSIVE METABOLIC PANEL
ALT: 26 U/L (ref 0–35)
AST: 21 U/L (ref 0–37)
Albumin: 4.2 g/dL (ref 3.5–5.2)
Alkaline Phosphatase: 94 U/L (ref 39–117)
BUN: 14 mg/dL (ref 6–23)
CO2: 26 mEq/L (ref 19–32)
Calcium: 9.3 mg/dL (ref 8.4–10.5)
Chloride: 104 mEq/L (ref 96–112)
Creatinine, Ser: 0.81 mg/dL (ref 0.40–1.20)
GFR: 82.54 mL/min (ref >60.00)
Glucose, Bld: 94 mg/dL (ref 70–99)
Potassium: 4.1 mEq/L (ref 3.5–5.1)
Sodium: 140 mEq/L (ref 135–145)
Total Bilirubin: 0.4 mg/dL (ref 0.2–1.2)
Total Protein: 7.9 g/dL (ref 6.0–8.3)

## 2021-12-07 LAB — LIPID PANEL
Cholesterol: 224 mg/dL — ABNORMAL HIGH (ref 0–200)
HDL: 40 mg/dL (ref >39.00)
LDL Cholesterol: 157 mg/dL — ABNORMAL HIGH (ref 0–99)
NonHDL: 183.74
Total CHOL/HDL Ratio: 6
Triglycerides: 132 mg/dL (ref 0.0–149.0)
VLDL: 26.4 mg/dL (ref 0.0–40.0)

## 2021-12-07 LAB — CBC WITH DIFFERENTIAL/PLATELET
Basophils Absolute: 0.1 10*3/uL (ref 0.0–0.1)
Basophils Relative: 0.9 % (ref 0.0–3.0)
Eosinophils Absolute: 0.1 10*3/uL (ref 0.0–0.7)
Eosinophils Relative: 2 % (ref 0.0–5.0)
HCT: 38.8 % (ref 36.0–46.0)
Hemoglobin: 12.9 g/dL (ref 12.0–15.0)
Lymphocytes Relative: 44.8 % (ref 12.0–46.0)
Lymphs Abs: 3.1 10*3/uL (ref 0.7–4.0)
MCHC: 33.2 g/dL (ref 30.0–36.0)
MCV: 89.6 fl (ref 78.0–100.0)
Monocytes Absolute: 0.5 10*3/uL (ref 0.1–1.0)
Monocytes Relative: 7.9 % (ref 3.0–12.0)
Neutro Abs: 3.1 10*3/uL (ref 1.4–7.7)
Neutrophils Relative %: 44.4 % (ref 43.0–77.0)
Platelets: 299 10*3/uL (ref 150.0–400.0)
RBC: 4.33 Mil/uL (ref 3.87–5.11)
RDW: 14.1 % (ref 11.5–15.5)
WBC: 6.9 10*3/uL (ref 4.0–10.5)

## 2021-12-07 LAB — HEMOGLOBIN A1C: Hgb A1c MFr Bld: 6.1 % (ref 4.6–6.5)

## 2022-01-03 ENCOUNTER — Other Ambulatory Visit: Payer: Self-pay | Admitting: Physician Assistant

## 2022-01-03 DIAGNOSIS — K219 Gastro-esophageal reflux disease without esophagitis: Secondary | ICD-10-CM

## 2022-01-14 ENCOUNTER — Other Ambulatory Visit: Payer: Self-pay | Admitting: Physician Assistant

## 2022-01-14 DIAGNOSIS — K219 Gastro-esophageal reflux disease without esophagitis: Secondary | ICD-10-CM

## 2022-01-17 ENCOUNTER — Encounter: Payer: Self-pay | Admitting: *Deleted

## 2022-02-02 ENCOUNTER — Encounter: Payer: Self-pay | Admitting: Physician Assistant

## 2022-02-03 ENCOUNTER — Encounter: Payer: Self-pay | Admitting: Physician Assistant

## 2022-02-03 ENCOUNTER — Ambulatory Visit: Payer: 59 | Admitting: Physician Assistant

## 2022-02-03 VITALS — BP 153/74 | HR 96 | Temp 98.4°F | Ht 60.0 in | Wt 172.4 lb

## 2022-02-03 DIAGNOSIS — H6091 Unspecified otitis externa, right ear: Secondary | ICD-10-CM

## 2022-02-03 DIAGNOSIS — K219 Gastro-esophageal reflux disease without esophagitis: Secondary | ICD-10-CM

## 2022-02-03 MED ORDER — OMEPRAZOLE 20 MG PO CPDR: 20.0000 mg | DELAYED_RELEASE_CAPSULE | Freq: Every day | ORAL | 3 refills | Status: DC

## 2022-02-03 MED ORDER — CIPROFLOXACIN-DEXAMETHASONE 0.3-0.1 % OT SUSP
4.0000 [drp] | Freq: Two times a day (BID) | OTIC | 0 refills | Status: AC
Start: 2022-02-03 — End: 2022-02-10

## 2022-02-03 NOTE — Progress Notes (Signed)
Subjective:    Patient ID: Melanie Washington, female    DOB: [birth year], 56 y.o.   MRN: [id number]  Chief Complaint  Patient presents with   Ear Drainage    Pt c/o ear drainage out of right ear, also having some pain in that ear at times; no recent colds or infection, just started with congestion this morning. Pt getting flu vaccine today in office    Ear Drainage    Patient is in today for right ear drainage and pain x 2 days. Uses Q-tips regularly to get water out of ears. No ringing, hearing loss, or dizziness. No HA's. No fever or chills.  Past Medical History:  Diagnosis Date   Anxiety    Back pain    Depression    Fear of other medical care    GERD (gastroesophageal reflux disease)    Iron deficiency anemia    Joint pain    Mixed hyperlipidemia     Past Surgical History:  Procedure Laterality Date   CESAREAN SECTION      Family History  Problem Relation Age of Onset   Hyperlipidemia Mother    Hypertension Mother    Cancer Father    Early death Father    Hyperlipidemia Father    Hypertension Father    Stroke Father    Diabetes Father    Kidney disease Father    Asthma Brother    Depression Brother        Died by suicide at age 64   COPD Maternal Grandmother    Alcohol abuse Maternal Grandfather    Cancer Maternal Grandfather    Early death Maternal Grandfather    Hyperlipidemia Maternal Grandfather    Alcohol abuse Paternal Grandmother    COPD Paternal Grandmother    Alcohol abuse Paternal Grandfather     Social History   Tobacco Use   Smoking status: Never   Smokeless tobacco: Never  Substance Use Topics   Alcohol use: Not Currently   Drug use: Not Currently     No Known Allergies  Review of Systems NEGATIVE UNLESS OTHERWISE INDICATED IN HPI      Objective:     BP (!) 153/74 (BP Location: Left Arm)   Pulse 96   Temp 98.4 F (36.9 C) (Temporal)   Ht 5' (1.524 m)   Wt 172 lb 6.4 oz (78.2 kg)   SpO2 99%   BMI 33.67 kg/m   Wt  Readings from Last 3 Encounters:  02/03/22 172 lb 6.4 oz (78.2 kg)  12/03/21 169 lb (76.7 kg)  11/26/21 169 lb (76.7 kg)    BP Readings from Last 3 Encounters:  02/03/22 (!) 153/74  12/03/21 (!) 152/80  11/09/21 (!) 176/94     Physical Exam Constitutional:      Appearance: Normal appearance.  HENT:     Head: Normocephalic.     Right Ear: Drainage present. There is no impacted cerumen. Tympanic membrane is not erythematous.     Left Ear: Tympanic membrane, ear canal and external ear normal. There is no impacted cerumen.     Ears:     Comments: R ear canal is erythematous, slightly edematous, purulent drainage noted. TM visualized, normal in appearance.  Neurological:     General: No focal deficit present.     Mental Status: She is alert.  Psychiatric:        Mood and Affect: Mood normal.        Assessment & Plan:  Otitis externa of  right ear, unspecified chronicity, unspecified type  Gastroesophageal reflux disease without esophagitis -     Omeprazole; Take 1 capsule (20 mg total) by mouth daily.  Dispense: 90 capsule; Refill: 3  Other orders -     Ciprofloxacin-dexAMETHasone; Place 4 drops into the right ear 2 (two) times daily for 7 days.  Dispense: 7.5 mL; Refill: 0   R otitis externa - Ciprodex drops as directed. Tylenol / Motrin for pain, heating pad. Urgent care if acutely worse.      Return if symptoms worsen or fail to improve.     Alaena Strader M Courtenay Creger, PA-C

## 2022-02-06 ENCOUNTER — Encounter: Payer: Self-pay | Admitting: Physician Assistant

## 2022-02-07 NOTE — Telephone Encounter (Signed)
Please see pt msg and advise 

## 2022-02-08 ENCOUNTER — Ambulatory Visit: Payer: 59 | Admitting: Physician Assistant

## 2022-02-08 ENCOUNTER — Encounter: Payer: Self-pay | Admitting: Family Medicine

## 2022-02-08 ENCOUNTER — Ambulatory Visit (INDEPENDENT_AMBULATORY_CARE_PROVIDER_SITE_OTHER): Payer: 59 | Admitting: Family Medicine

## 2022-02-08 VITALS — BP 132/82 | HR 88 | Temp 97.5°F | Ht 60.0 in | Wt 172.8 lb

## 2022-02-08 DIAGNOSIS — I158 Other secondary hypertension: Secondary | ICD-10-CM

## 2022-02-08 DIAGNOSIS — R7303 Prediabetes: Secondary | ICD-10-CM

## 2022-02-08 DIAGNOSIS — H6090 Unspecified otitis externa, unspecified ear: Secondary | ICD-10-CM

## 2022-02-08 MED ORDER — AMOXICILLIN-POT CLAVULANATE 875-125 MG PO TABS: 1.0000 | ORAL_TABLET | Freq: Two times a day (BID) | ORAL | 0 refills | Status: DC

## 2022-02-08 NOTE — Progress Notes (Signed)
   Melanie Washington is a 56 y.o. female who presents today for an office visit.  Assessment/Plan:  New/Acute Problems: Otitis Externa Did not have improvement with Ciprodex.  She has some evidence of otitis media as well.  Will start Augmentin.  She can continue using over-the-counter medications for pain control.  We discussed reasons to return to care.  Follow-up as needed.  Will need referral to ENT if not improving.  Chronic Problems Addressed Today: Essential hypertension Doing well on lisinopril 20 mg daily.   Blood pressure is at goal today.  Prediabetes Last A1c 6.1.  Increases risk for complicated infection.  We will start oral antibiotics for otitis externa as above.    Subjective:  HPI:  Patient here for otitis externa follow-up.  Was seen by her PCP 5 days ago diagnosed with otitis externa.  She was started on Ciprodex drops.  Pain has persisted. Decreased hearing also. Symptoms about the same last few days. No fevers or chills.        Objective:  Physical Exam: BP 132/82 (BP Location: Right Arm)   Pulse 88   Temp (!) 97.5 F (36.4 C) (Temporal)   Ht 5' (1.524 m)   Wt 172 lb 12.8 oz (78.4 kg)   SpO2 98%   BMI 33.75 kg/m   Gen: No acute distress, resting comfortably HEENT: Left TM clear. Right EAC with purulent debris. Right TM erythematous and bulding CV: Regular rate and rhythm with no murmurs appreciated Pulm: Normal work of breathing, clear to auscultation bilaterally with no crackles, wheezes, or rhonchi Neuro: Grossly normal, moves all extremities Psych: Normal affect and thought content      [name] M. Jerline Pain, MD 02/08/2022 8:32 AM

## 2022-02-15 ENCOUNTER — Other Ambulatory Visit: Payer: Self-pay | Admitting: Physician Assistant

## 2022-02-21 ENCOUNTER — Other Ambulatory Visit: Payer: Self-pay | Admitting: Physician Assistant

## 2022-03-10 ENCOUNTER — Other Ambulatory Visit: Payer: Self-pay | Admitting: Physician Assistant

## 2022-04-07 ENCOUNTER — Encounter: Payer: Self-pay | Admitting: *Deleted

## 2022-05-22 ENCOUNTER — Other Ambulatory Visit: Payer: Self-pay | Admitting: Physician Assistant

## 2022-06-07 ENCOUNTER — Ambulatory Visit: Payer: 59 | Admitting: Physician Assistant

## 2022-06-07 ENCOUNTER — Encounter: Payer: Self-pay | Admitting: Physician Assistant

## 2022-06-07 VITALS — BP 156/74 | HR 88 | Temp 97.8°F | Ht 60.0 in | Wt 172.2 lb

## 2022-06-07 DIAGNOSIS — Z23 Encounter for immunization: Secondary | ICD-10-CM

## 2022-06-07 DIAGNOSIS — F40232 Fear of other medical care: Secondary | ICD-10-CM

## 2022-06-07 DIAGNOSIS — F341 Dysthymic disorder: Secondary | ICD-10-CM | POA: Diagnosis not present

## 2022-06-07 DIAGNOSIS — K219 Gastro-esophageal reflux disease without esophagitis: Secondary | ICD-10-CM | POA: Diagnosis not present

## 2022-06-07 DIAGNOSIS — F419 Anxiety disorder, unspecified: Secondary | ICD-10-CM

## 2022-06-07 DIAGNOSIS — F32A Depression, unspecified: Secondary | ICD-10-CM

## 2022-06-07 MED ORDER — BUSPIRONE HCL 15 MG PO TABS: 15.0000 mg | ORAL_TABLET | Freq: Three times a day (TID) | ORAL | 1 refills | Status: DC

## 2022-06-07 MED ORDER — BUPROPION HCL ER (XL) 300 MG PO TB24: 300.0000 mg | ORAL_TABLET | Freq: Every day | ORAL | 3 refills | Status: DC

## 2022-06-07 MED ORDER — LISINOPRIL 20 MG PO TABS: 20.0000 mg | ORAL_TABLET | Freq: Every day | ORAL | 3 refills | Status: DC

## 2022-06-07 MED ORDER — FLUOXETINE HCL 20 MG PO CAPS: 20.0000 mg | ORAL_CAPSULE | Freq: Every morning | ORAL | 3 refills | Status: DC

## 2022-06-07 MED ORDER — OMEPRAZOLE 20 MG PO CPDR: 20.0000 mg | DELAYED_RELEASE_CAPSULE | Freq: Every day | ORAL | 3 refills | Status: DC

## 2022-06-07 NOTE — Assessment & Plan Note (Signed)
Improving, but still present - persistently has WCS - elevated BP readings in office; normal at home

## 2022-06-07 NOTE — Assessment & Plan Note (Signed)
Stable Omeprazole 20 mg

## 2022-06-07 NOTE — Assessment & Plan Note (Signed)
Stable, encouraged to keep up the good work  Wellbutrin Xl 300 mg daily Buspar 15 mg TID Prozac 20 mg qd

## 2022-06-07 NOTE — Progress Notes (Signed)
Subjective:    Patient ID: Melanie Washington, female    DOB: 05-24-1965, 57 y.o.   MRN: [id number]  Chief Complaint  Patient presents with   Follow-up    Pt in the office for follow up and med check; pt says she is better now than she has been in a long while, no concerns to discuss.     HPI Patient is in today for regular medication check. No medical changes since last visit. Taking medications as prescribed, feels good, no acute concerns.    Past Medical History:  Diagnosis Date   Anxiety    Back pain    Depression    Fear of other medical care    GERD (gastroesophageal reflux disease)    Iron deficiency anemia    Joint pain    Mixed hyperlipidemia     Past Surgical History:  Procedure Laterality Date   CESAREAN SECTION      Family History  Problem Relation Age of Onset   Hyperlipidemia Mother    Hypertension Mother    Cancer Father    Early death Father    Hyperlipidemia Father    Hypertension Father    Stroke Father    Diabetes Father    Kidney disease Father    Asthma Brother    Depression Brother        Died by suicide at age 51   COPD Maternal Grandmother    Alcohol abuse Maternal Grandfather    Cancer Maternal Grandfather    Early death Maternal Grandfather    Hyperlipidemia Maternal Grandfather    Alcohol abuse Paternal Grandmother    COPD Paternal Grandmother    Alcohol abuse Paternal Grandfather     Social History   Tobacco Use   Smoking status: Never   Smokeless tobacco: Never  Substance Use Topics   Alcohol use: Not Currently   Drug use: Not Currently     No Known Allergies  Review of Systems NEGATIVE UNLESS OTHERWISE INDICATED IN HPI      Objective:     BP (!) 156/74 (BP Location: Left Arm) Comment (BP Location): manual  Pulse 88   Temp 97.8 F (36.6 C) (Temporal)   Ht 5' (1.524 m)   Wt 172 lb 3.2 oz (78.1 kg)   SpO2 100%   BMI 33.63 kg/m   Wt Readings from Last 3 Encounters:  06/07/22 172 lb 3.2 oz (78.1 kg)  02/08/22  172 lb 12.8 oz (78.4 kg)  02/03/22 172 lb 6.4 oz (78.2 kg)    BP Readings from Last 3 Encounters:  06/07/22 (!) 156/74  02/08/22 132/82  02/03/22 (!) 153/74     Physical Exam Vitals and nursing note reviewed.  Constitutional:      Appearance: Normal appearance. She is obese.  Cardiovascular:     Rate and Rhythm: Normal rate and regular rhythm.     Pulses: Normal pulses.  Pulmonary:     Effort: Pulmonary effort is normal.     Breath sounds: Normal breath sounds.  Neurological:     General: No focal deficit present.     Mental Status: She is alert and oriented to person, place, and time.  Psychiatric:        Behavior: Behavior normal.     Comments: Anxiousness noted, normal for patient with medical anxiety history        Assessment & Plan:  Anxiety and depression Assessment & Plan: Stable, encouraged to keep up the good work  Wellbutrin Xl 300  mg daily Buspar 15 mg TID Prozac 20 mg qd    Gastroesophageal reflux disease without esophagitis Assessment & Plan: Stable Omeprazole 20 mg  Orders: -     Omeprazole; Take 1 capsule (20 mg total) by mouth daily.  Dispense: 90 capsule; Refill: 3  Dysthymia -     buPROPion HCl ER (XL); Take 1 tablet (300 mg total) by mouth daily.  Dispense: 90 tablet; Refill: 3  Need for immunization against influenza -     Flu Vaccine QUAD 52moIM (Fluarix, Fluzone & Alfiuria Quad PF)  Fear of medical care Assessment & Plan: Improving, but still present - persistently has WCS - elevated BP readings in office; normal at home    Other orders -     Lisinopril; Take 1 tablet (20 mg total) by mouth daily.  Dispense: 90 tablet; Refill: 3 -     FLUoxetine HCl; Take 1 capsule (20 mg total) by mouth every morning.  Dispense: 90 capsule; Refill: 3 -     busPIRone HCl; Take 1 tablet (15 mg total) by mouth 3 (three) times daily.  Dispense: 270 tablet; Refill: 1       Return in about 6 months (around 12/06/2022) for CPE, fasting labs .  This  note was prepared with assistance of [employer] analyst Occasional wrong-word or sound-a-like substitutions may have occurred due to the inherent limitations of voice recognition software.   Nylene Inlow M Brittney Caraway, PA-C

## 2022-06-15 ENCOUNTER — Other Ambulatory Visit: Payer: Self-pay

## 2022-11-21 ENCOUNTER — Encounter: Payer: 59 | Admitting: Physician Assistant

## 2022-11-23 ENCOUNTER — Encounter (INDEPENDENT_AMBULATORY_CARE_PROVIDER_SITE_OTHER): Payer: Self-pay

## 2022-12-14 ENCOUNTER — Other Ambulatory Visit: Payer: Self-pay

## 2022-12-14 ENCOUNTER — Telehealth: Payer: Self-pay | Admitting: Physician Assistant

## 2022-12-14 DIAGNOSIS — Z0000 Encounter for general adult medical examination without abnormal findings: Secondary | ICD-10-CM

## 2022-12-14 NOTE — Telephone Encounter (Signed)
Future lab orders placed and called pt to schedule fasting lab appt. Pt verbalized understanding of fasting labs

## 2022-12-14 NOTE — Telephone Encounter (Signed)
Pt would like to have labs ordered prior to CPE. Please advise

## 2022-12-15 ENCOUNTER — Other Ambulatory Visit (INDEPENDENT_AMBULATORY_CARE_PROVIDER_SITE_OTHER): Payer: 59

## 2022-12-15 DIAGNOSIS — Z0000 Encounter for general adult medical examination without abnormal findings: Secondary | ICD-10-CM

## 2022-12-15 LAB — CBC WITH DIFFERENTIAL/PLATELET
Basophils Absolute: 0.1 10*3/uL (ref 0.0–0.1)
Basophils Relative: 0.9 % (ref 0.0–3.0)
Eosinophils Absolute: 0.1 10*3/uL (ref 0.0–0.7)
Eosinophils Relative: 1.4 % (ref 0.0–5.0)
HCT: 38.7 % (ref 36.0–46.0)
Hemoglobin: 12.6 g/dL (ref 12.0–15.0)
Lymphocytes Relative: 41.9 % (ref 12.0–46.0)
Lymphs Abs: 3 10*3/uL (ref 0.7–4.0)
MCHC: 32.6 g/dL (ref 30.0–36.0)
MCV: 90 fl (ref 78.0–100.0)
Monocytes Absolute: 0.6 10*3/uL (ref 0.1–1.0)
Monocytes Relative: 8.5 % (ref 3.0–12.0)
Neutro Abs: 3.4 10*3/uL (ref 1.4–7.7)
Neutrophils Relative %: 47.3 % (ref 43.0–77.0)
Platelets: 305 10*3/uL (ref 150.0–400.0)
RBC: 4.3 Mil/uL (ref 3.87–5.11)
RDW: 15 % (ref 11.5–15.5)
WBC: 7.2 10*3/uL (ref 4.0–10.5)

## 2022-12-15 LAB — LIPID PANEL
Cholesterol: 243 mg/dL — ABNORMAL HIGH (ref 0–200)
HDL: 42.4 mg/dL (ref >39.00)
LDL Cholesterol: 182 mg/dL — ABNORMAL HIGH (ref 0–99)
NonHDL: 200.86
Total CHOL/HDL Ratio: 6
Triglycerides: 94 mg/dL (ref 0.0–149.0)
VLDL: 18.8 mg/dL (ref 0.0–40.0)

## 2022-12-15 LAB — COMPREHENSIVE METABOLIC PANEL
ALT: 29 U/L (ref 0–35)
AST: 23 U/L (ref 0–37)
Albumin: 4.2 g/dL (ref 3.5–5.2)
Alkaline Phosphatase: 99 U/L (ref 39–117)
BUN: 16 mg/dL (ref 6–23)
CO2: 28 meq/L (ref 19–32)
Calcium: 9.3 mg/dL (ref 8.4–10.5)
Chloride: 103 meq/L (ref 96–112)
Creatinine, Ser: 0.85 mg/dL (ref 0.40–1.20)
GFR: 77.34 mL/min (ref >60.00)
Glucose, Bld: 99 mg/dL (ref 70–99)
Potassium: 4.1 meq/L (ref 3.5–5.1)
Sodium: 137 meq/L (ref 135–145)
Total Bilirubin: 0.5 mg/dL (ref 0.2–1.2)
Total Protein: 8 g/dL (ref 6.0–8.3)

## 2022-12-15 LAB — HEMOGLOBIN A1C: Hgb A1c MFr Bld: 6.1 % (ref 4.6–6.5)

## 2022-12-16 LAB — TSH: TSH: 2.07 u[IU]/mL (ref 0.35–5.50)

## 2022-12-19 MED ORDER — ATORVASTATIN CALCIUM 10 MG PO TABS: 10.0000 mg | ORAL_TABLET | Freq: Every evening | ORAL | 1 refills | Status: DC

## 2022-12-21 ENCOUNTER — Ambulatory Visit (INDEPENDENT_AMBULATORY_CARE_PROVIDER_SITE_OTHER): Payer: 59 | Admitting: Physician Assistant

## 2022-12-21 VITALS — BP 148/90 | HR 89 | Temp 97.8°F | Ht 60.0 in | Wt 175.0 lb

## 2022-12-21 DIAGNOSIS — I158 Other secondary hypertension: Secondary | ICD-10-CM

## 2022-12-21 DIAGNOSIS — R0683 Snoring: Secondary | ICD-10-CM

## 2022-12-21 DIAGNOSIS — F32A Depression, unspecified: Secondary | ICD-10-CM

## 2022-12-21 DIAGNOSIS — F419 Anxiety disorder, unspecified: Secondary | ICD-10-CM

## 2022-12-21 DIAGNOSIS — Z0000 Encounter for general adult medical examination without abnormal findings: Secondary | ICD-10-CM | POA: Diagnosis not present

## 2022-12-21 DIAGNOSIS — E782 Mixed hyperlipidemia: Secondary | ICD-10-CM | POA: Diagnosis not present

## 2022-12-21 NOTE — Assessment & Plan Note (Signed)
Stable, encouraged to keep up the good work  Wellbutrin Xl 300 mg daily Buspar 15 mg TID Prozac 20 mg qd

## 2022-12-21 NOTE — Patient Instructions (Addendum)
Wonderful to see you! Keep up the excellent work!  Someone will call to schedule your sleep consult.   Call if any concerns. Try to monitor BP at home.

## 2022-12-21 NOTE — Assessment & Plan Note (Signed)
Likely OSA - referral for sleep HOME study

## 2022-12-21 NOTE — Assessment & Plan Note (Signed)
Currently started on Lipitor 10 mg daily Good lifestyle changes already  Pt aware of risks vs benefits and possible adverse reactions  The 10-year ASCVD risk score (Arnett DK, et al., 2019) is: 5.6%   Values used to calculate the score:     Age: 57 years     Sex: Female     Is Non-Hispanic African American: No     Diabetic: No     Tobacco smoker: No     Systolic Blood Pressure: 148 mmHg     Is BP treated: Yes     HDL Cholesterol: 42.4 mg/dL     Total Cholesterol: 243 mg/dL

## 2022-12-21 NOTE — Assessment & Plan Note (Signed)
Elevated - hx of WCS, asked her to monitor at home Continue Lisinopril 20 mg daily

## 2022-12-21 NOTE — Progress Notes (Signed)
Subjective:    Patient ID: Melanie Washington, female    DOB: 1965/06/28, 57 y.o.   MRN: [id number]  HPI Patient is in today for annual exam.  Acute concerns: No acute health concerns   Health maintenance: Lifestyle/ exercise: Walking all day at work; working out at gym at the hospital  Nutrition: Breakfast foods are enjoyed the most; fairly well-balanced Mental health: Prozac, Buspar, and Wellbutrin working well - "very happy" per patient  Caffeine: None Sleep: Still not sleeping well; usually up around 2 or 3 am and up for the day; very loud snoring per husband - having to sleep in separate bedrooms; she does have some episodes where she wakes up feeling like she can't breathe Substance use: None ETOH: None  Sexual activity: Monogamous with husband Not UTD with health maintenance - declines due to her medical anxiety  Skin: doing well, no new concerns   Past Medical History:  Diagnosis Date   Anxiety    Back pain    Depression    Fear of other medical care    GERD (gastroesophageal reflux disease)    Iron deficiency anemia    Joint pain    Mixed hyperlipidemia     Past Surgical History:  Procedure Laterality Date   CESAREAN SECTION      Family History  Problem Relation Age of Onset   Hyperlipidemia Mother    Hypertension Mother    Cancer Father    Early death Father    Hyperlipidemia Father    Hypertension Father    Stroke Father    Diabetes Father    Kidney disease Father    Asthma Brother    Depression Brother        Died by suicide at age 3   COPD Maternal Grandmother    Alcohol abuse Maternal Grandfather    Cancer Maternal Grandfather    Early death Maternal Grandfather    Hyperlipidemia Maternal Grandfather    Alcohol abuse Paternal Grandmother    COPD Paternal Grandmother    Alcohol abuse Paternal Grandfather     Social History   Tobacco Use   Smoking status: Never   Smokeless tobacco: Never  Substance Use Topics   Alcohol use: Not Currently    Drug use: Not Currently     No Known Allergies  Review of Systems NEGATIVE UNLESS OTHERWISE INDICATED IN HPI      Objective:     BP (!) 148/90 (BP Location: Left Arm, Patient Position: Sitting, Cuff Size: Normal)   Pulse 89   Temp 97.8 F (36.6 C) (Temporal)   Ht 5' (1.524 m)   Wt 175 lb (79.4 kg)   SpO2 97%   BMI 34.18 kg/m   Wt Readings from Last 3 Encounters:  12/21/22 175 lb (79.4 kg)  06/07/22 172 lb 3.2 oz (78.1 kg)  02/08/22 172 lb 12.8 oz (78.4 kg)    BP Readings from Last 3 Encounters:  12/21/22 (!) 148/90  06/07/22 (!) 156/74  02/08/22 132/82     Physical Exam Vitals and nursing note reviewed.  Constitutional:      Appearance: Normal appearance. She is obese. She is not toxic-appearing.  HENT:     Head: Normocephalic and atraumatic.     Right Ear: Tympanic membrane, ear canal and external ear normal.     Left Ear: Tympanic membrane, ear canal and external ear normal.     Nose: Nose normal.     Mouth/Throat:     Mouth: Mucous membranes  are moist.  Eyes:     Extraocular Movements: Extraocular movements intact.     Conjunctiva/sclera: Conjunctivae normal.     Pupils: Pupils are equal, round, and reactive to light.  Cardiovascular:     Rate and Rhythm: Normal rate and regular rhythm.     Pulses: Normal pulses.     Heart sounds: Normal heart sounds.  Pulmonary:     Effort: Pulmonary effort is normal.     Breath sounds: Normal breath sounds.  Abdominal:     General: Abdomen is flat. Bowel sounds are normal.     Palpations: Abdomen is soft.  Musculoskeletal:        General: Normal range of motion.     Cervical back: Normal range of motion and neck supple.  Skin:    General: Skin is warm and dry.  Neurological:     General: No focal deficit present.     Mental Status: She is alert and oriented to person, place, and time.  Psychiatric:        Mood and Affect: Mood normal.        Behavior: Behavior normal.        Thought Content: Thought  content normal.        Judgment: Judgment normal.        Assessment & Plan:   Problem List Items Addressed This Visit       Cardiovascular and Mediastinum   essential hypertension     Elevated - hx of WCS, asked her to monitor at home Continue Lisinopril 20 mg daily         Other   Anxiety and depression    Stable, encouraged to keep up the good work  Wellbutrin Xl 300 mg daily Buspar 15 mg TID Prozac 20 mg qd       Mixed hyperlipidemia    Currently started on Lipitor 10 mg daily Good lifestyle changes already  Pt aware of risks vs benefits and possible adverse reactions  The 10-year ASCVD risk score (Arnett DK, et al., 2019) is: 5.6%   Values used to calculate the score:     Age: 79 years     Sex: Female     Is Non-Hispanic African American: No     Diabetic: No     Tobacco smoker: No     Systolic Blood Pressure: 148 mmHg     Is BP treated: Yes     HDL Cholesterol: 42.4 mg/dL     Total Cholesterol: 243 mg/dL       Loud snoring    Likely OSA - referral for sleep HOME study       Relevant Orders   Ambulatory referral to Sleep Studies   Other Visit Diagnoses     Encounter for annual physical exam    -  Primary        Plan: Age-appropriate screening and counseling performed today. Reviewed labs with her today. Preventive measures discussed and printed in AVS for patient. She still declines health maintenance items such as pap smear, mammo, cologuard, due to severe medical anxiety. She will let me know if she changes her mind about this in the future. Aware of risks not having regular screenings.  Patient Counseling: [x]   Nutrition: Stressed importance of moderation in sodium/caffeine intake, saturated fat and cholesterol, caloric balance, sufficient intake of fresh fruits, vegetables, and fiber.  [x]   Stressed the importance of regular exercise.   []   Substance Abuse: Discussed cessation/primary prevention of tobacco, alcohol, or other  drug use; driving  or other dangerous activities under the influence; availability of treatment for abuse.   []   Injury prevention: Discussed safety belts, safety helmets, smoke detector, smoking near bedding or upholstery.   []   Sexuality: Discussed sexually transmitted diseases, partner selection, use of condoms, avoidance of unintended pregnancy  and contraceptive alternatives.   [x]   Dental health: Discussed importance of regular tooth brushing, flossing, and dental visits.  [x]   Health maintenance and immunizations reviewed. Please refer to Health maintenance section.      Return in about 6 months (around 06/23/2023) for recheck/follow-up, fasting lipid panel .    Rosita Guzzetta M Mega Kinkade, PA-C

## 2022-12-26 ENCOUNTER — Encounter: Payer: Self-pay | Admitting: Physician Assistant

## 2022-12-27 NOTE — Telephone Encounter (Signed)
Schedule office visit to examine foot, please advise

## 2022-12-27 NOTE — Telephone Encounter (Signed)
FYI patient declined appt.

## 2023-01-05 ENCOUNTER — Other Ambulatory Visit: Payer: Self-pay | Admitting: Physician Assistant

## 2023-01-05 DIAGNOSIS — F341 Dysthymic disorder: Secondary | ICD-10-CM

## 2023-02-06 ENCOUNTER — Ambulatory Visit (INDEPENDENT_AMBULATORY_CARE_PROVIDER_SITE_OTHER): Payer: Managed Care, Other (non HMO) | Admitting: Neurology

## 2023-02-06 ENCOUNTER — Encounter: Payer: Self-pay | Admitting: Neurology

## 2023-02-06 VITALS — BP 154/80 | HR 84 | Ht 61.0 in | Wt 179.0 lb

## 2023-02-06 DIAGNOSIS — G4700 Insomnia, unspecified: Secondary | ICD-10-CM

## 2023-02-06 DIAGNOSIS — R0683 Snoring: Secondary | ICD-10-CM | POA: Diagnosis not present

## 2023-02-06 DIAGNOSIS — G4719 Other hypersomnia: Secondary | ICD-10-CM

## 2023-02-06 DIAGNOSIS — E66811 Obesity, class 1: Secondary | ICD-10-CM

## 2023-02-06 DIAGNOSIS — Z9189 Other specified personal risk factors, not elsewhere classified: Secondary | ICD-10-CM

## 2023-02-06 DIAGNOSIS — R030 Elevated blood-pressure reading, without diagnosis of hypertension: Secondary | ICD-10-CM

## 2023-02-06 NOTE — Patient Instructions (Signed)

## 2023-02-06 NOTE — Progress Notes (Signed)
Subjective:    Patient ID: Melanie Washington is a 57 y.o. female.  HPI    Huston Foley, MD, PhD [facility] [address] [address] [place], [place] [address]  Dear Arlyss Repress,  I saw your patient, Melanie Washington, upon your kind request in my sleep clinic today for initial consultation of her sleep disorder, in particular, concern for underlying obstructive sleep apnea.  The patient is unaccompanied today.  As you know, Melanie Washington is a 56 year old female with an underlying medical history of hyperlipidemia, hypertension, depression, anxiety, back pain, reflux disease, iron deficiency anemia, and mild obesity, who reports snoring and excessive daytime somnolence.  Her Epworth sleepiness score is 11/24, fatigue severity score is 21/63.  I reviewed your office note from 12/21/2022.  He is married and lives with her husband.  She has 1 grown daughter who lives on her own with her family.  Patient is a non-smoker and drinks no alcohol, no daily caffeine, drinks decaf soda, about 2 bottles per day, 20 ounce size.  She goes to bed generally around 9 or 10 PM and rise time is 6:10 AM.  She works as a Set designer.  She has no pets in the household.  She does have a TV in the bedroom but it is not on at night.  She has rarely woken up with a sense of gasping for air.  He had a tonsillectomy as a child.  Weight has been more or less stable.  She has some difficulty maintaining sleep.  Sometimes she is up for 2 to 3 hours in the middle of the night, sometimes she wakes up a few times per night but does not stay awake long.  She has not tried melatonin.  She has occasionally taken ZzzQuil before bedtime.  She did not take her blood pressure medication today but is planning to do so when she gets home.  She denies nightly nocturia and does not typically have any recurrent nocturnal or morning headaches.  She is not aware of any family history of sleep apnea.  Her  Past Medical History Is Significant For: Past Medical History:  Diagnosis Date   Anxiety    Back pain    Depression    Fear of other medical care    GERD (gastroesophageal reflux disease)    Iron deficiency anemia    Joint pain    Mixed hyperlipidemia     Her Past Surgical History Is Significant For: Past Surgical History:  Procedure Laterality Date   CESAREAN SECTION      Her Family History Is Significant For: Family History  Problem Relation Age of Onset   Hyperlipidemia Mother    Hypertension Mother    Cancer Father    Early death Father    Hyperlipidemia Father    Hypertension Father    Stroke Father    Diabetes Father    Kidney disease Father    Asthma Brother    Depression Brother        Died by suicide at age 71   COPD Maternal Grandmother    Alcohol abuse Maternal Grandfather    Cancer Maternal Grandfather    Early death Maternal Grandfather    Hyperlipidemia Maternal Grandfather    Alcohol abuse Paternal Grandmother    COPD Paternal Grandmother    Alcohol abuse Paternal Grandfather     Her Social History Is Significant For: Social History   Socioeconomic History   Marital status:  Married    Spouse name: Not on file   Number of children: Not on file   Years of education: Not on file   Highest education level: Not on file  Occupational History   Occupation: RECEPTION    Employer: [employer]  Tobacco Use   Smoking status: Never   Smokeless tobacco: Never  Substance and Sexual Activity   Alcohol use: Not Currently   Drug use: Not Currently   Sexual activity: Yes  Other Topics Concern   Not on file  Social History Narrative   Teased after her brother's friend saw her naked when she was a teen. Goes to [employer]. Therapy not helpful. Brother died by suicide age 82. Fear of medical care.   Social Determinants of Health   Financial Resource Strain: Not on file  Food Insecurity: Not on file  Transportation Needs: Not on file  Physical Activity:  Not on file  Stress: Not on file  Social Connections: Not on file    Her Allergies Are:  No Known Allergies:   Her Current Medications Are:  Outpatient Encounter Medications as of 02/06/2023  Medication Sig   atorvastatin (LIPITOR) 10 MG tablet Take 1 tablet (10 mg total) by mouth at bedtime.   buPROPion (WELLBUTRIN XL) 300 MG 24 hr tablet TAKE 1 TABLET BY MOUTH DAILY   busPIRone (BUSPAR) 15 MG tablet Take 1 tablet (15 mg total) by mouth 3 (three) times daily.   fexofenadine (ALLEGRA) 60 MG tablet Take by mouth.   FLUoxetine (PROZAC) 20 MG capsule Take 1 capsule (20 mg total) by mouth every morning.   lisinopril (ZESTRIL) 20 MG tablet Take 1 tablet (20 mg total) by mouth daily.   omeprazole (PRILOSEC) 20 MG capsule Take 1 capsule (20 mg total) by mouth daily.   No facility-administered encounter medications on file as of 02/06/2023.  :   Review of Systems:  Out of a complete 14 point review of systems, all are reviewed and negative with the exception of these symptoms as listed below:   Review of Systems  Neurological:        Melanie Washington Pt is well, reports she has no concerns with falling a sleep but she has a hard time staying a asleep. She gets about 7-8 hrs of  broken sleep a night. She has occasionally woken up feeling smothered. Has loud snoring.   No known family history of OSA. Never had sleep study.     Objective:  Neurological Exam  Physical Exam Physical Examination:   Vitals:   02/06/23 0841 02/06/23 0848  BP: (!) 178/90 (!) 154/80  Pulse: 85 84    General Examination: The patient is a very pleasant 57 y.o. female in no acute distress. She appears well-developed and well-nourished and well groomed.   HEENT: Normocephalic, atraumatic, pupils are equal, round and reactive to light, extraocular tracking is good without limitation to gaze excursion or nystagmus noted. Hearing is grossly intact. Face is symmetric with normal facial animation. Speech is clear with  no dysarthria noted. There is no hypophonia. There is no lip, neck/head, jaw or voice tremor. Neck is supple with full range of passive and active motion. There are no carotid bruits on auscultation. Oropharynx exam reveals: mild mouth dryness, adequate dental hygiene and mild airway crowding, due to smaller airway entry. Mallampati is class I. Tongue protrudes centrally and palate elevates symmetrically. Tonsils are absent. Neck size is 14.25 inches. She has a moderate to significant overbite.   Chest: Clear  to auscultation without wheezing, rhonchi or crackles noted.  Heart: S1+S2+0, regular and normal without murmurs, rubs or gallops noted.   Abdomen: Soft, non-tender and non-distended.  Extremities: There is no pitting edema in the distal lower extremities bilaterally.   Skin: Warm and dry without trophic changes noted.   Musculoskeletal: exam reveals no obvious joint deformities.   Neurologically:  Mental status: The patient is awake, alert and oriented in all 4 spheres. Her immediate and remote memory, attention, language skills and fund of knowledge are appropriate. There is no evidence of aphasia, agnosia, apraxia or anomia. Speech is clear with normal prosody and enunciation. Thought process is linear. Mood is normal and affect is normal.  Cranial nerves II - XII are as described above under HEENT exam.  Motor exam: Normal bulk, strength and tone is noted. There is no obvious action or resting tremor.  Fine motor skills and coordination: grossly intact.  Cerebellar testing: No dysmetria or intention tremor. There is no truncal or gait ataxia.  Sensory exam: intact to light touch in the upper and lower extremities.  Gait, station and balance: She stands easily. No veering to one side is noted. No leaning to one side is noted. Posture is age-appropriate and stance is narrow based. Gait shows normal stride length and normal pace. No problems turning are noted.   Assessment and Plan:  In  summary, Melanie Washington is a very pleasant 57 y.o.-year old female with an underlying medical history of hyperlipidemia, hypertension, depression, anxiety, back pain, reflux disease, iron deficiency anemia, and mild obesity, whose history and physical exam are concerning for sleep disordered breathing, particularly, obstructive sleep apnea (OSA). While a laboratory attended sleep study is typically considered "gold standard" for evaluation of sleep disordered breathing, we mutually agreed to proceed with a home sleep test at this time.   I had a long chat with the patient about my findings and the diagnosis of sleep apnea, particularly OSA, its prognosis and treatment options. We talked about medical/conservative treatments, surgical interventions and non-pharmacological approaches for symptom control. I explained, in particular, the risks and ramifications of untreated moderate to severe OSA, especially with respect to developing cardiovascular disease down the road, including congestive heart failure (CHF), difficult to treat hypertension, cardiac arrhythmias (particularly A-fib), neurovascular complications including TIA, stroke and dementia. Even type 2 diabetes has, in part, been linked to untreated OSA. Symptoms of untreated OSA may include (but may not be limited to) daytime sleepiness, nocturia (i.e. frequent nighttime urination), memory problems, mood irritability and suboptimally controlled or worsening mood disorder such as depression and/or anxiety, lack of energy, lack of motivation, physical discomfort, as well as recurrent headaches, especially morning or nocturnal headaches. We talked about the importance of maintaining a healthy lifestyle and striving for healthy weight.  In addition, we talked about the importance of striving for and maintaining good sleep hygiene. I recommended a sleep study at this time. I outlined the differences between a laboratory attended sleep study which is considered  more comprehensive and accurate over the option of a home sleep test (HST); the latter may lead to underestimation of sleep disordered breathing in some instances and does not help with diagnosing upper airway resistance syndrome and is not accurate enough to diagnose primary central sleep apnea typically. I outlined possible surgical and non-surgical treatment options of OSA, including the use of a positive airway pressure (PAP) device (i.e. CPAP, AutoPAP/APAP or BiPAP in certain circumstances), a custom-made dental device (aka oral appliance, which  would require a referral to a specialist dentist or orthodontist typically, and is generally speaking not considered for patients with full dentures or edentulous state), upper airway surgical options, such as traditional UPPP (which is not considered a first-line treatment) or the Inspire device (hypoglossal nerve stimulator, which would involve a referral for consultation with an ENT surgeon, after careful selection, following inclusion criteria - also not first-line treatment). I explained the PAP treatment option to the patient in detail, as this is generally considered first-line treatment.  The patient indicated that she would be willing to try PAP therapy, if the need arises. I explained the importance of being compliant with PAP treatment, not only for insurance purposes but primarily to improve patient's symptoms symptoms, and for the patient's long term health benefit, including to reduce Her cardiovascular risks longer-term.    We will pick up our discussion about the next steps and treatment options after testing.  We will keep her posted as to the test results by phone call and/or MyChart messaging where possible.  We will plan to follow-up in sleep clinic accordingly as well.  I answered all her questions today and the patient was in agreement.   I encouraged her to call with any interim questions, concerns, problems or updates or email [place] through  MyChart.  Generally speaking, sleep test authorizations may take up to 2 weeks, sometimes less, sometimes longer, the patient is encouraged to get in touch with [place] if they do not hear back from the sleep lab staff directly within the next 2 weeks.  Thank you very much for allowing me to participate in the care of this nice patient. If I can be of any further assistance to you please do not hesitate to call me at [phone].  Sincerely,   Huston Foley, MD, PhD

## 2023-02-08 ENCOUNTER — Other Ambulatory Visit: Payer: Self-pay | Admitting: Physician Assistant

## 2023-02-27 ENCOUNTER — Ambulatory Visit: Payer: Managed Care, Other (non HMO) | Admitting: Neurology

## 2023-02-27 DIAGNOSIS — R030 Elevated blood-pressure reading, without diagnosis of hypertension: Secondary | ICD-10-CM

## 2023-02-27 DIAGNOSIS — Z9189 Other specified personal risk factors, not elsewhere classified: Secondary | ICD-10-CM

## 2023-02-27 DIAGNOSIS — G4733 Obstructive sleep apnea (adult) (pediatric): Secondary | ICD-10-CM | POA: Diagnosis not present

## 2023-02-27 DIAGNOSIS — E66811 Obesity, class 1: Secondary | ICD-10-CM

## 2023-02-27 DIAGNOSIS — G4719 Other hypersomnia: Secondary | ICD-10-CM

## 2023-02-27 DIAGNOSIS — G4700 Insomnia, unspecified: Secondary | ICD-10-CM

## 2023-02-27 DIAGNOSIS — G4731 Primary central sleep apnea: Secondary | ICD-10-CM

## 2023-02-27 DIAGNOSIS — R0683 Snoring: Secondary | ICD-10-CM

## 2023-03-16 NOTE — Progress Notes (Signed)
See procedure note.

## 2023-03-20 NOTE — Addendum Note (Signed)
Addended by: Huston Foley on: 03/20/2023 06:52 PM   Modules accepted: Orders

## 2023-03-20 NOTE — Procedures (Signed)
[facility]  HOME SLEEP TEST (Watch PAT) REPORT -  Mail-out Device  STUDY DATE: 03/10/2023  DOB: March 23, 1966  MRN: [id number]  ORDERING CLINICIAN: Huston Foley, MD, PhD   REFERRING CLINICIAN: Allwardt, Crist Infante, PA-C   CLINICAL INFORMATION/HISTORY: 57 year old female with an underlying medical history of hyperlipidemia, hypertension, depression, anxiety, back pain, reflux disease, iron deficiency anemia, and mild obesity, who reports snoring and excessive daytime somnolence.    Epworth sleepiness score: 11/24.  BMI: 33.8 kg/m  FINDINGS:   Sleep Summary:   Total Recording Time (hours, min): 7 hours, 45 min  Total Sleep Time (hours, min):  6 hours, 20 min  Percent REM (%):    15.5%   Respiratory Indices:   Calculated pAHI (per hour):  32.7/hour         REM pAHI:    33/hour       NREM pAHI: 32.6/hour  Central pAHI: 7.7/hour  Oxygen Saturation Statistics:    Oxygen Saturation (%) Mean: 93%   Minimum oxygen saturation (%):                 85%   O2 Saturation Range (%): 85 - 99%    O2 Saturation (minutes) <=88%: 0.8 min  Pulse Rate Statistics:   Pulse Mean (bpm):    70/min    Pulse Range (61 - 104/min)   IMPRESSION: OSA (obstructive sleep apnea), severe Central Sleep Apnea  RECOMMENDATION:  This home sleep test demonstrates severe obstructive sleep apnea with a total AHI of 32.7/hour and O2 nadir of 85%. There was milder central apnea component as well. Snoring was detected, in the mild to moderate range, at times louder. Treatment with positive airway pressure is highly recommended. The patient will be advised to proceed with an autoPAP titration/trial at home. A laboratory attended titration study can be considered in the future for optimization of treatment settings and to improve tolerance and compliance, if needed, down the road. Alternative treatment options are limited secondary to the severity of the patient's sleep disordered  breathing, but may include surgical treatment with an implantable hypoglossal nerve stimulator (in carefully selected candidates, meeting criteria).  Concomitant weight loss is recommended (where clinically appropriate). Please note, that untreated obstructive sleep apnea may carry additional perioperative morbidity. Patients with significant obstructive sleep apnea should receive perioperative PAP therapy and the surgeons and particularly the anesthesiologist should be informed of the diagnosis and the severity of the sleep disordered breathing. The patient should be cautioned not to drive, work at heights, or operate dangerous or heavy equipment when tired or sleepy. Review and reiteration of good sleep hygiene measures should be pursued with any patient. Other causes of the patient's symptoms, including circadian rhythm disturbances, an underlying mood disorder, medication effect and/or an underlying medical problem cannot be ruled out based on this test. Clinical correlation is recommended.  The patient and her referring provider will be notified of the test results. The patient will be seen in follow up in sleep clinic at [facility].  I certify that I have reviewed the raw data recording prior to the issuance of this report in accordance with the standards of the American Academy of Sleep Medicine (AASM).    INTERPRETING PHYSICIAN:   Huston Foley, MD, PhD Medical Director, [facility]) Diplomat, ABPN (Neurology and Sleep)   [facility] [address], [address] [place], [place] [address] [phone]

## 2023-03-22 NOTE — Telephone Encounter (Addendum)
Melanie Washington, CMA  Zott, Melanie Washington; Gamble, Tammy New orders have been placed for the above pt, DOB: 02-28-2066  ThanksZott, Donette [name], [name] Amsterdam, CMA; [name] Got It Thank You

## 2023-04-20 LAB — HM HEPATITIS C SCREENING LAB: HM Hepatitis Screen: NEGATIVE

## 2023-04-28 ENCOUNTER — Encounter: Payer: Self-pay | Admitting: Physician Assistant

## 2023-04-28 ENCOUNTER — Ambulatory Visit: Payer: Self-pay | Admitting: Physician Assistant

## 2023-04-28 ENCOUNTER — Ambulatory Visit: Payer: Managed Care, Other (non HMO) | Admitting: Physician Assistant

## 2023-04-28 VITALS — BP 144/74 | HR 74 | Temp 98.2°F | Ht 61.0 in | Wt 180.0 lb

## 2023-04-28 DIAGNOSIS — H65192 Other acute nonsuppurative otitis media, left ear: Secondary | ICD-10-CM | POA: Diagnosis not present

## 2023-04-28 MED ORDER — AMOXICILLIN-POT CLAVULANATE 875-125 MG PO TABS
1.0000 | ORAL_TABLET | Freq: Two times a day (BID) | ORAL | 0 refills | Status: AC
Start: 2023-04-28 — End: 2023-05-05

## 2023-04-28 NOTE — Progress Notes (Signed)
 Patient ID: Melanie Washington, female    DOB: [birth year], 58 y.o.   MRN: [id number]   Assessment & Plan:  Acute otitis media with effusion of left ear -     Amoxicillin -Pot Clavulanate; Take 1 tablet by mouth 2 (two) times daily for 7 days. Take with food.  Dispense: 14 tablet; Refill: 0   Assessment and Plan    Otitis Media Acute onset left ear pain, likely secondary to recent upper respiratory infection. Eardrum appears inflamed with effusion noted. -Prescribe Augmentin  875mg  twice daily for 7 days. -Advise over-the-counter Sudafed to alleviate sinus pressure. -Encourage adequate hydration.  Upper Respiratory Infection Ongoing cough and congestion since late November, with intermittent improvement. -Continue Augmentin  as prescribed for otitis media, which will also cover any lingering bacterial infection in the respiratory tract.         No follow-ups on file.    Subjective:    Chief Complaint  Patient presents with   Ear Pain    Pt in office for left ear pain; was advised to go to min clinic and PCP advised pt to come in the office; pt has been having chest congestion and cough for over 1 week; taking Dayquil OTC and helps but not getting rid of it; no body aches, fever or fatigue.     HPI Discussed the use of AI scribe software for clinical note transcription with the patient, who gave verbal consent to proceed.  History of Present Illness   The patient presents with left ear pain that started early this morning, severe enough to wake her from sleep. She has been experiencing intermittent illness since the week after Thanksgiving, with symptoms improving and then worsening again. Currently, she also reports cough and congestion, but denies fever, chills, and symptoms of COVID-19 or flu. She has not been diagnosed with any respiratory illness. The ear pain is a new symptom that started this morning.  In addition to the ear pain, the patient has been dealing with a  persistent cough and congestion. She has not been diagnosed with any respiratory illness, but the symptoms have been ongoing since the week after Thanksgiving. She has had periods of improvement, but the symptoms have worsened again recently.       Past Medical History:  Diagnosis Date   Anxiety    Back pain    Depression    Fear of other medical care    GERD (gastroesophageal reflux disease)    Iron deficiency anemia    Joint pain    Mixed hyperlipidemia     Past Surgical History:  Procedure Laterality Date   CESAREAN SECTION      Family History  Problem Relation Age of Onset   Hyperlipidemia Mother    Hypertension Mother    Cancer Father    Early death Father    Hyperlipidemia Father    Hypertension Father    Stroke Father    Diabetes Father    Kidney disease Father    Asthma Brother    Depression Brother        Died by suicide at age 72   COPD Maternal Grandmother    Alcohol abuse Maternal Grandfather    Cancer Maternal Grandfather    Early death Maternal Grandfather    Hyperlipidemia Maternal Grandfather    Alcohol abuse Paternal Grandmother    COPD Paternal Grandmother    Alcohol abuse Paternal Grandfather     Social History   Tobacco Use   Smoking status: Never  Smokeless tobacco: Never  Substance Use Topics   Alcohol use: Not Currently   Drug use: Not Currently     No Known Allergies  Review of Systems NEGATIVE UNLESS OTHERWISE INDICATED IN HPI      Objective:     BP (!) 144/74 (BP Location: Left Arm, Patient Position: Sitting, Cuff Size: Large)   Pulse 74   Temp 98.2 F (36.8 C) (Temporal)   Ht 5' 1 (1.549 m)   Wt 180 lb (81.6 kg)   SpO2 98%   BMI 34.01 kg/m   Wt Readings from Last 3 Encounters:  04/28/23 180 lb (81.6 kg)  02/06/23 179 lb (81.2 kg)  12/21/22 175 lb (79.4 kg)    BP Readings from Last 3 Encounters:  04/28/23 (!) 144/74  02/06/23 (!) 154/80  12/21/22 (!) 148/90     Physical Exam Vitals and nursing note  reviewed.  Constitutional:      General: She is not in acute distress.    Appearance: Normal appearance. She is not ill-appearing.  HENT:     Head: Normocephalic.     Right Ear: Ear canal and external ear normal. A middle ear effusion is present. Tympanic membrane is not erythematous or bulging.     Left Ear: Ear canal and external ear normal. A middle ear effusion is present. Tympanic membrane is erythematous and bulging.     Nose: Congestion present.     Mouth/Throat:     Mouth: Mucous membranes are moist.     Pharynx: No oropharyngeal exudate or posterior oropharyngeal erythema.  Eyes:     Extraocular Movements: Extraocular movements intact.     Conjunctiva/sclera: Conjunctivae normal.     Pupils: Pupils are equal, round, and reactive to light.  Cardiovascular:     Rate and Rhythm: Normal rate and regular rhythm.     Pulses: Normal pulses.     Heart sounds: Normal heart sounds. No murmur heard. Pulmonary:     Effort: Pulmonary effort is normal. No respiratory distress.     Breath sounds: Normal breath sounds. No wheezing.  Musculoskeletal:     Cervical back: Normal range of motion.  Skin:    General: Skin is warm.  Neurological:     Mental Status: She is alert and oriented to person, place, and time.  Psychiatric:        Mood and Affect: Mood normal.        Behavior: Behavior normal.      Melanie Giambra M Deantae Shackleton, PA-C

## 2023-04-28 NOTE — Telephone Encounter (Addendum)
 Copied from CRM [id number]. Topic: Clinical - Red Word Triage >> Apr 28, 2023  9:32 AM Rosina BIRCH wrote: Red Word that prompted transfer to Nurse Triage: left ear pain  Chief Complaint:  Patient reports Left Ear pain and congestion that started yesterday. Cold symptoms of Runny nose and Headache  . Symptoms:  Patient rates the pain as 3 out of 10 - Mild. Also rate headache pain. Frequency:  Patient reports continuous  pain Pertinent Negatives: Patient denies  sticking any object nor any drainage. Disposition: [] ED /[x] Urgent Care (no appt availability in office) / [] Appointment(In office/virtual)/ []  [facility]/ [] Home Care/ [] Refused Recommended Disposition /[] [facility]/ []  Follow-up with PCP Additional Notes:  Patient states she will go to Urgent Care  or [facility]. Reason for Disposition  Earache  (Exceptions: brief ear pain of < 60 minutes duration, earache occurring during air travel  Answer Assessment - Initial Assessment Questions 1. LOCATION: Which ear is involved?      Left  Ear Pain 2. ONSET: When did the ear start hurting       Started on yesterday 3. SEVERITY: How bad is the pain?  (Scale 1-10; mild, moderate or severe)   - MILD (1-3): doesn't interfere with normal activities    Rate Pain 3 out  of 10  4. URI SYMPTOMS: Do you have a runny nose or cough?      Cold  Symptoms    Yes she has  runny nose and cough 5. FEVER: Do you have a fever? If Yes, ask: What is your temperature, how was it measured, and when did it start?      Denies fever. 6. CAUSE: Have you been swimming recently?, How often do you use Q-TIPS?, Have you had any recent air travel or scuba diving?     Denies. 7. OTHER SYMPTOMS: Do you have any other symptoms? (e.g., headache, stiff neck, dizziness, vomiting, runny nose, decreased hearing)      Runny nose and  Headache around her eyes and temple  8. PREGNANCY: Is there any chance you are pregnant? When was  your last menstrual period?      Denies  Protocols used: Earache-A-AH

## 2023-04-28 NOTE — Telephone Encounter (Signed)
 Patient added to schedule per PCP request

## 2023-05-08 ENCOUNTER — Encounter: Payer: Self-pay | Admitting: Physician Assistant

## 2023-05-10 ENCOUNTER — Ambulatory Visit: Payer: Managed Care, Other (non HMO) | Admitting: Physician Assistant

## 2023-05-10 ENCOUNTER — Encounter: Payer: Self-pay | Admitting: Physician Assistant

## 2023-05-10 VITALS — BP 154/78 | HR 87 | Temp 98.4°F | Ht 61.0 in | Wt 179.2 lb

## 2023-05-10 DIAGNOSIS — H65192 Other acute nonsuppurative otitis media, left ear: Secondary | ICD-10-CM

## 2023-05-10 MED ORDER — METHYLPREDNISOLONE 4 MG PO TBPK
ORAL_TABLET | ORAL | 0 refills | Status: DC
Start: 2023-05-10 — End: 2023-06-22

## 2023-05-10 MED ORDER — AMOXICILLIN 875 MG PO TABS
875.0000 mg | ORAL_TABLET | Freq: Two times a day (BID) | ORAL | 0 refills | Status: AC
Start: 2023-05-10 — End: 2023-05-17

## 2023-05-10 NOTE — Progress Notes (Signed)
 Patient ID: Melanie Washington, female    DOB: 23-Sep-1965, 58 y.o.   MRN: [id number]   Assessment & Plan:  Acute otitis media with effusion of left ear -     Amoxicillin ; Take 1 tablet (875 mg total) by mouth 2 (two) times daily for 7 days.  Dispense: 14 tablet; Refill: 0 -     methylPREDNISolone ; Please take per packaging instructions.  Dispense: 21 tablet; Refill: 0    Assessment and Plan    Otitis Media Persistent symptoms despite Augmentin  treatment. Possible small rupture with drainage, but not visible on exam / ?spontaneously re-healing already. Currently experiencing intermittent sharp pain and hearing loss. -Start Amoxicillin  875mg  twice daily for 5-7 days. -Start Medrol  Dosepak to reduce inflammation. -Use water precautions to avoid further irritation or infection. -If symptoms persist, consider referral to audiology or ENT.          No follow-ups on file.    Subjective:    Chief Complaint  Patient presents with   Otalgia    Pt states after last visit ear fullness and felt left ear pop had drainage; still hurts some but now unable to hear out of left ear; finished antibiotic last Thursday    HPI Discussed the use of AI scribe software for clinical note transcription with the patient, who gave verbal consent to proceed.  History of Present Illness   The patient presents with left ear pain and hearing loss. She reports a history of recurrent ear infections, typically once a year, which have previously been successfully treated with amoxicillin . The current episode began a couple of weeks ago, after which she completed a course of Augmentin  and used Sudafed. She describes a sensation of a 'pop' in the ear, followed by drainage and temporary relief of pain. However, the pain has since returned intermittently, lasting for a few seconds to minutes at a time. The primary concern now is a significant reduction in hearing in the affected ear.       Past Medical History:   Diagnosis Date   Anxiety    Back pain    Depression    Fear of other medical care    GERD (gastroesophageal reflux disease)    Iron deficiency anemia    Joint pain    Mixed hyperlipidemia     Past Surgical History:  Procedure Laterality Date   CESAREAN SECTION      Family History  Problem Relation Age of Onset   Hyperlipidemia Mother    Hypertension Mother    Cancer Father    Early death Father    Hyperlipidemia Father    Hypertension Father    Stroke Father    Diabetes Father    Kidney disease Father    Asthma Brother    Depression Brother        Died by suicide at age 77   COPD Maternal Grandmother    Alcohol abuse Maternal Grandfather    Cancer Maternal Grandfather    Early death Maternal Grandfather    Hyperlipidemia Maternal Grandfather    Alcohol abuse Paternal Grandmother    COPD Paternal Grandmother    Alcohol abuse Paternal Grandfather     Social History   Tobacco Use   Smoking status: Never   Smokeless tobacco: Never  Substance Use Topics   Alcohol use: Not Currently   Drug use: Not Currently     No Known Allergies  Review of Systems NEGATIVE UNLESS OTHERWISE INDICATED IN HPI  Objective:     BP (!) 154/78 (BP Location: Left Arm, Patient Position: Sitting, Cuff Size: Normal)   Pulse 87   Temp 98.4 F (36.9 C) (Temporal)   Ht 5\' 1"  (1.549 m)   Wt 179 lb 3.2 oz (81.3 kg)   SpO2 97%   BMI 33.86 kg/m   Wt Readings from Last 3 Encounters:  05/10/23 179 lb 3.2 oz (81.3 kg)  04/28/23 180 lb (81.6 kg)  02/06/23 179 lb (81.2 kg)    BP Readings from Last 3 Encounters:  05/10/23 (!) 154/78  04/28/23 (!) 144/74  02/06/23 (!) 154/80     Physical Exam Vitals and nursing note reviewed.  Constitutional:      General: She is not in acute distress.    Appearance: Normal appearance. She is not ill-appearing.  HENT:     Head: Normocephalic.     Right Ear: Ear canal and external ear normal. No middle ear effusion. Tympanic membrane is  not erythematous or bulging.     Left Ear: Ear canal and external ear normal. No drainage. A middle ear effusion is present. Tympanic membrane is erythematous. Tympanic membrane is not perforated or bulging.     Nose: No congestion.     Mouth/Throat:     Mouth: Mucous membranes are moist.     Pharynx: No oropharyngeal exudate or posterior oropharyngeal erythema.  Eyes:     Extraocular Movements: Extraocular movements intact.     Conjunctiva/sclera: Conjunctivae normal.     Pupils: Pupils are equal, round, and reactive to light.  Cardiovascular:     Rate and Rhythm: Normal rate and regular rhythm.     Pulses: Normal pulses.     Heart sounds: Normal heart sounds. No murmur heard. Pulmonary:     Effort: Pulmonary effort is normal. No respiratory distress.     Breath sounds: Normal breath sounds. No wheezing.  Musculoskeletal:     Cervical back: Normal range of motion.  Skin:    General: Skin is warm.  Neurological:     Mental Status: She is alert and oriented to person, place, and time.  Psychiatric:        Mood and Affect: Mood normal.        Behavior: Behavior normal.         Tiron Suski M Kayti Poss, PA-C

## 2023-05-29 ENCOUNTER — Other Ambulatory Visit: Payer: Self-pay | Admitting: Physician Assistant

## 2023-06-15 ENCOUNTER — Ambulatory Visit: Payer: Managed Care, Other (non HMO) | Admitting: Adult Health

## 2023-06-23 ENCOUNTER — Ambulatory Visit: Payer: Managed Care, Other (non HMO) | Admitting: Physician Assistant

## 2023-06-23 ENCOUNTER — Encounter: Payer: Self-pay | Admitting: Physician Assistant

## 2023-06-23 VITALS — BP 142/86 | HR 82 | Temp 96.6°F | Ht 61.0 in | Wt 180.0 lb

## 2023-06-23 DIAGNOSIS — K219 Gastro-esophageal reflux disease without esophagitis: Secondary | ICD-10-CM

## 2023-06-23 DIAGNOSIS — G4733 Obstructive sleep apnea (adult) (pediatric): Secondary | ICD-10-CM

## 2023-06-23 DIAGNOSIS — H6992 Unspecified Eustachian tube disorder, left ear: Secondary | ICD-10-CM

## 2023-06-23 DIAGNOSIS — I158 Other secondary hypertension: Secondary | ICD-10-CM

## 2023-06-23 DIAGNOSIS — F419 Anxiety disorder, unspecified: Secondary | ICD-10-CM

## 2023-06-23 DIAGNOSIS — R7303 Prediabetes: Secondary | ICD-10-CM | POA: Diagnosis not present

## 2023-06-23 DIAGNOSIS — F32A Depression, unspecified: Secondary | ICD-10-CM

## 2023-06-23 DIAGNOSIS — E782 Mixed hyperlipidemia: Secondary | ICD-10-CM

## 2023-06-23 LAB — LIPID PANEL
Cholesterol: 159 mg/dL (ref 0–200)
HDL: 39.2 mg/dL (ref >39.00)
LDL Cholesterol: 102 mg/dL — ABNORMAL HIGH (ref 0–99)
NonHDL: 119.35
Total CHOL/HDL Ratio: 4
Triglycerides: 86 mg/dL (ref 0.0–149.0)
VLDL: 17.2 mg/dL (ref 0.0–40.0)

## 2023-06-23 LAB — HEMOGLOBIN A1C: Hgb A1c MFr Bld: 6.1 % (ref 4.6–6.5)

## 2023-06-23 MED ORDER — ATORVASTATIN CALCIUM 10 MG PO TABS: 10.0000 mg | ORAL_TABLET | Freq: Every evening | ORAL | 1 refills | Status: DC

## 2023-06-23 MED ORDER — BUSPIRONE HCL 15 MG PO TABS: 15.0000 mg | ORAL_TABLET | Freq: Three times a day (TID) | ORAL | 1 refills | Status: DC

## 2023-06-23 MED ORDER — OMEPRAZOLE 20 MG PO CPDR: 20.0000 mg | DELAYED_RELEASE_CAPSULE | Freq: Every day | ORAL | 3 refills | Status: AC

## 2023-06-23 MED ORDER — BUPROPION HCL ER (XL) 300 MG PO TB24: 300.0000 mg | ORAL_TABLET | Freq: Every day | ORAL | 3 refills | Status: AC

## 2023-06-23 MED ORDER — LISINOPRIL 20 MG PO TABS: 20.0000 mg | ORAL_TABLET | Freq: Every day | ORAL | 3 refills | Status: AC

## 2023-06-23 MED ORDER — FLUOXETINE HCL 20 MG PO CAPS
20.0000 mg | ORAL_CAPSULE | Freq: Every morning | ORAL | 3 refills | Status: AC
Start: 2023-06-23 — End: ?

## 2023-06-23 NOTE — Progress Notes (Signed)
 .   Patient ID: Melanie Washington, female    DOB: 10-Sep-1965, 58 y.o.   MRN: [id number]   Assessment & Plan:  Prediabetes -     Hemoglobin A1c  Mixed hyperlipidemia -     Lipid panel  Gastroesophageal reflux disease without esophagitis -     Omeprazole; Take 1 capsule (20 mg total) by mouth daily.  Dispense: 90 capsule; Refill: 3  OSA (obstructive sleep apnea)  Anxiety and depression  essential hypertension   Unspecified Eustachian tube disorder, left ear  Other orders -     busPIRone HCl; Take 1 tablet (15 mg total) by mouth 3 (three) times daily.  Dispense: 270 tablet; Refill: 1 -     FLUoxetine HCl; Take 1 capsule (20 mg total) by mouth every morning.  Dispense: 90 capsule; Refill: 3 -     Lisinopril; Take 1 tablet (20 mg total) by mouth daily.  Dispense: 90 tablet; Refill: 3 -     Atorvastatin Calcium; Take 1 tablet (10 mg total) by mouth at bedtime.  Dispense: 90 tablet; Refill: 1 -     buPROPion HCl ER (XL); Take 1 tablet (300 mg total) by mouth daily.  Dispense: 90 tablet; Refill: 3    Assessment and Plan    Ear discomfort post-infection Resolved ear infection with residual discomfort. Possible association with recent initiation of CPAP therapy. No signs of current infection or obstruction. -Continue current management and monitor symptoms. Consider ENT referral if symptoms persist or worsen.  Sleep Apnea Recent initiation of CPAP therapy with noted improvement in sleep quality and energy levels. -Continue CPAP therapy as prescribed.  Anxiety/Depression Stable on current regimen of Buspar, Prozac, and Wellbutrin. Reports feeling good. -Continue current medication regimen.  Hypertension Reports blood pressure is controlled at home. -Continue Lisinopril. Recheck blood pressure today.  Hyperlipidemia On cholesterol medication. -Order lipid panel to assess efficacy of current treatment.  Prediabetes A1c has been stable at 6.1. -Order A1c to monitor for  changes.  General Health Maintenance -Refill all current medications. -Plan for follow-up in six months with potential for labs depending on cholesterol results.          Return in about 6 months (around 12/21/2023) for recheck/follow-up.    Subjective:    Chief Complaint  Patient presents with   Depression    Patient is doing well states and is fasting for her labs. Patient is concerned ever since she started her c pap machine has had ear infection and feels like it water in there in the left ear.    Depression        Discussed the use of AI scribe software for clinical note transcription with the patient, who gave verbal consent to proceed.  History of Present Illness   Melanie Washington is a 58 year old female who presents with persistent ear discomfort.  She experiences ongoing discomfort in her ear following a recent ear infection. She was treated with amoxicillin and a Medrol Dosepak, which alleviated the pain, but she describes a persistent sensation of the ear feeling 'stuck' as if there is water in it. This sensation began shortly after she started using a CPAP machine on April 24, 2023. She notes that the ear does not hurt, but the sensation persists, and she can hear out of it, albeit not very loudly.  She has been using a CPAP machine for sleep apnea since April 24, 2023, and reports significant improvement in her sleep quality, stating she now sleeps through  the night unless she needs to use the bathroom. She feels more rested and has noticed an increase in her energy levels.  Her current medications include Buspar, Prozac, lisinopril, omeprazole, Wellbutrin, and Allegra. Her anxiety and depression are well-managed with her current regimen. She is also on cholesterol medication and her A1c has been stable at 6.1. She is monitoring her blood pressure at home, which she reports as being okay. She is on lisinopril for management.  In terms of physical activity, she  has been swimming at the gym, which she finds beneficial. She is trying to maintain her exercise routine by swimming and using a kickboard.       Past Medical History:  Diagnosis Date   Anxiety    Back pain    Depression    Fear of other medical care    GERD (gastroesophageal reflux disease)    Iron deficiency anemia    Joint pain    Mixed hyperlipidemia     Past Surgical History:  Procedure Laterality Date   CESAREAN SECTION      Family History  Problem Relation Age of Onset   Hyperlipidemia Mother    Hypertension Mother    Cancer Father    Early death Father    Hyperlipidemia Father    Hypertension Father    Stroke Father    Diabetes Father    Kidney disease Father    Asthma Brother    Depression Brother        Died by suicide at age 65   COPD Maternal Grandmother    Alcohol abuse Maternal Grandfather    Cancer Maternal Grandfather    Early death Maternal Grandfather    Hyperlipidemia Maternal Grandfather    Alcohol abuse Paternal Grandmother    COPD Paternal Grandmother    Alcohol abuse Paternal Grandfather     Social History   Tobacco Use   Smoking status: Never   Smokeless tobacco: Never  Substance Use Topics   Alcohol use: Not Currently   Drug use: Not Currently     No Known Allergies  Review of Systems  Psychiatric/Behavioral:  Positive for depression.    NEGATIVE UNLESS OTHERWISE INDICATED IN HPI      Objective:     BP (!) 160/80   Pulse 82   Temp (!) 96.6 F (35.9 C) (Temporal)   Ht 5\' 1"  (1.549 m)   Wt 180 lb (81.6 kg)   SpO2 98%   BMI 34.01 kg/m   Wt Readings from Last 3 Encounters:  06/23/23 180 lb (81.6 kg)  05/10/23 179 lb 3.2 oz (81.3 kg)  04/28/23 180 lb (81.6 kg)    BP Readings from Last 3 Encounters:  06/23/23 (!) 160/80  05/10/23 (!) 154/78  04/28/23 (!) 144/74     Physical Exam Vitals and nursing note reviewed.  Constitutional:      General: She is not in acute distress.    Appearance: Normal appearance.  She is not ill-appearing.  HENT:     Head: Normocephalic.     Right Ear: Tympanic membrane, ear canal and external ear normal.     Left Ear: Tympanic membrane, ear canal and external ear normal.     Nose: No congestion.     Mouth/Throat:     Mouth: Mucous membranes are moist.     Pharynx: No oropharyngeal exudate or posterior oropharyngeal erythema.  Eyes:     Extraocular Movements: Extraocular movements intact.     Conjunctiva/sclera: Conjunctivae normal.  Pupils: Pupils are equal, round, and reactive to light.  Cardiovascular:     Rate and Rhythm: Normal rate and regular rhythm.     Pulses: Normal pulses.     Heart sounds: Normal heart sounds. No murmur heard. Pulmonary:     Effort: Pulmonary effort is normal. No respiratory distress.     Breath sounds: Normal breath sounds. No wheezing.  Musculoskeletal:     Cervical back: Normal range of motion.  Skin:    General: Skin is warm.  Neurological:     Mental Status: She is alert and oriented to person, place, and time.  Psychiatric:        Mood and Affect: Mood normal.        Behavior: Behavior normal.       Casha Estupinan M Abdinasir Spadafore, PA-C

## 2023-06-26 ENCOUNTER — Encounter: Payer: Self-pay | Admitting: Physician Assistant
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
## 2023-07-03 ENCOUNTER — Ambulatory Visit (INDEPENDENT_AMBULATORY_CARE_PROVIDER_SITE_OTHER): Payer: Managed Care, Other (non HMO) | Admitting: Adult Health

## 2023-07-03 ENCOUNTER — Encounter: Payer: Self-pay | Admitting: Adult Health

## 2023-07-03 VITALS — BP 172/82 | HR 82 | Ht 60.0 in | Wt 182.5 lb

## 2023-07-03 DIAGNOSIS — H6992 Unspecified Eustachian tube disorder, left ear: Secondary | ICD-10-CM

## 2023-07-03 DIAGNOSIS — G4733 Obstructive sleep apnea (adult) (pediatric): Secondary | ICD-10-CM

## 2023-07-03 NOTE — Patient Instructions (Addendum)
 Your Plan:  Continue nightly use of CPAP with ensuring greater than 4 hours per night  Continue to follow with your DME [facility] for any needs supplies or CPAP related concerns  Would recommend trying Mucinex to help with middle ear pressure.  If symptoms persist, please follow-up with your PCP for possible need of ENT evaluation     Follow up in 6 months or call earlier if needed     Thank you for coming to see [place] at [facility]. I hope we have been able to provide you high quality care today.  You may receive a patient satisfaction survey over the next few weeks. We would appreciate your feedback and comments so that we may continue to improve ourselves and the health of our patients.

## 2023-07-05 NOTE — Progress Notes (Signed)
 Zott, Stacy  Jes Costales, [name] [place], CMA; Elsie Lincoln, [place] Got It Thank You       Previous Messages    ----- Message ----- From: Bobbye Morton, CMA Sent: 07/04/2023   1:51 PM EDT To: Marlou Porch Zott Subject: cpap                                          New orders have been placed for the above pt, DOB: November 10, 2065 Thanks

## 2023-11-13 ENCOUNTER — Other Ambulatory Visit: Payer: Self-pay

## 2023-11-13 ENCOUNTER — Telehealth: Payer: Self-pay | Admitting: Physician Assistant

## 2023-11-13 DIAGNOSIS — R7303 Prediabetes: Secondary | ICD-10-CM

## 2023-11-13 DIAGNOSIS — E782 Mixed hyperlipidemia: Secondary | ICD-10-CM

## 2023-11-13 NOTE — Telephone Encounter (Signed)
 Future orders placed per PCP recommendations

## 2023-11-13 NOTE — Telephone Encounter (Signed)
 Patient is scheduled for labs 12/13/23 (1 week prior to OV). Please place Orders.

## 2023-11-13 NOTE — Telephone Encounter (Signed)
 Please advise on labs you are wanting to repeat. Nothing in lab note indicating repeat.

## 2023-12-13 ENCOUNTER — Other Ambulatory Visit (INDEPENDENT_AMBULATORY_CARE_PROVIDER_SITE_OTHER): Payer: Managed Care, Other (non HMO)

## 2023-12-13 ENCOUNTER — Ambulatory Visit: Payer: Self-pay | Admitting: Physician Assistant

## 2023-12-13 DIAGNOSIS — E782 Mixed hyperlipidemia: Secondary | ICD-10-CM

## 2023-12-13 DIAGNOSIS — R7303 Prediabetes: Secondary | ICD-10-CM

## 2023-12-13 LAB — LIPID PANEL
Cholesterol: 151 mg/dL (ref 0–200)
HDL: 38.3 mg/dL — ABNORMAL LOW (ref >39.00)
LDL Cholesterol: 90 mg/dL (ref 0–99)
NonHDL: 112.37
Total CHOL/HDL Ratio: 4
Triglycerides: 111 mg/dL (ref 0.0–149.0)
VLDL: 22.2 mg/dL (ref 0.0–40.0)

## 2023-12-13 LAB — HEMOGLOBIN A1C: Hgb A1c MFr Bld: 6.4 % (ref 4.6–6.5)

## 2023-12-18 ENCOUNTER — Other Ambulatory Visit: Payer: Self-pay | Admitting: Physician Assistant

## 2023-12-21 ENCOUNTER — Ambulatory Visit: Payer: Managed Care, Other (non HMO) | Admitting: Physician Assistant

## 2023-12-21 VITALS — BP 160/78 | HR 75 | Temp 98.5°F | Ht 60.0 in | Wt 185.0 lb

## 2023-12-21 DIAGNOSIS — I158 Other secondary hypertension: Secondary | ICD-10-CM

## 2023-12-21 DIAGNOSIS — F32A Depression, unspecified: Secondary | ICD-10-CM

## 2023-12-21 DIAGNOSIS — R7303 Prediabetes: Secondary | ICD-10-CM

## 2023-12-21 DIAGNOSIS — K219 Gastro-esophageal reflux disease without esophagitis: Secondary | ICD-10-CM

## 2023-12-21 DIAGNOSIS — E782 Mixed hyperlipidemia: Secondary | ICD-10-CM | POA: Diagnosis not present

## 2023-12-21 DIAGNOSIS — Z23 Encounter for immunization: Secondary | ICD-10-CM

## 2023-12-21 DIAGNOSIS — F419 Anxiety disorder, unspecified: Secondary | ICD-10-CM | POA: Diagnosis not present

## 2023-12-21 DIAGNOSIS — F40232 Fear of other medical care: Secondary | ICD-10-CM

## 2023-12-21 NOTE — Progress Notes (Signed)
 Patient ID: Melanie Washington, female    DOB: 07-31-1965, 58 y.o.   MRN: [id number]   Assessment & Plan:  Prediabetes  Mixed hyperlipidemia  Anxiety and depression  essential hypertension   Gastroesophageal reflux disease without esophagitis  Fear of medical care  Immunization due -     Varicella-zoster vaccine IM      Assessment and Plan Assessment & Plan Hypertension Hypertension is managed with lisinopril  20 mg. Office blood pressure readings are consistently high, likely due to white coat syndrome. Home blood pressure monitoring is recommended to ensure control with a target of 130/80 mmHg or less. - Continue lisinopril  20 mg daily - Monitor blood pressure at home with a target of 130/80 mmHg or less  Mixed hyperlipidemia Mixed hyperlipidemia is well-controlled with Lipitor 10 mg. Recent cholesterol levels are satisfactory. - Continue Lipitor 10 mg daily Lab Results  Component Value Date   CHOL 151 12/13/2023   HDL 38.30 (L) 12/13/2023   LDLCALC 90 12/13/2023   TRIG 111.0 12/13/2023   CHOLHDL 4 12/13/2023     Prediabetes Recent dietary changes include discontinuing high-sugar beverages. Encouraged to continue physical activity and dietary adjustments to improve glycemic control. - Continue lifestyle modifications including dietary changes and physical activity Lab Results  Component Value Date   HGBA1C 6.4 12/13/2023   HGBA1C 6.1 06/23/2023   HGBA1C 6.1 12/15/2022     Gastroesophageal reflux disease (GERD) GERD is managed with omeprazole  20 mg. No new symptoms reported. - Continue omeprazole  20 mg daily  Anxiety disorder and depression Anxiety and depression are managed with Wellbutrin  XL 300 mg, Buspar  15 mg three times daily, and Prozac  20 mg daily. Mental health is stable with current regimen. - Continue Wellbutrin  XL 300 mg daily - Continue Buspar  15 mg three times daily - Continue Prozac  20 mg daily  Fear of medical care (white coat  syndrome) Fear of medical care contributes to elevated blood pressure readings in the office. Efforts to gradually address this fear are ongoing. - Encourage home blood pressure monitoring to reduce anxiety associated with office visits      Return in about 6 months (around 06/22/2024) for recheck/follow-up.    Subjective:    Chief Complaint  Patient presents with   Follow-up    Patient states nothing to discuss that she can think of.     HPI Discussed the use of AI scribe software for clinical note transcription with the patient, who gave verbal consent to proceed.  History of Present Illness QUIN MCPHERSON is a 58 year old female with prediabetes, mixed hyperlipidemia, anxiety, depression, hypertension, and GERD who presents for a six-month follow-up.  She recently identified V8 Splash as a significant source of sugar in her diet and has since eliminated it, opting for water.  Her mixed hyperlipidemia is being managed with Lipitor 10 mg daily.  She experiences anxiety and depression, managed with Wellbutrin  XL 300 mg daily, Buspar  15 mg three times daily, and Prozac  20 mg daily. She feels stable on this regimen but experiences medical anxiety and white coat syndrome, particularly during office visits.  Her hypertension is treated with lisinopril  20 mg daily. She notes elevated blood pressure readings during office visits, likely related to her anxiety.  She has a history of GERD and takes omeprazole  20 mg as needed.  Socially, she remains active, walking regularly at work and home. She supports her daughter and grandson, who are living with her. Her grandson has autism, and she is  involved in family matters.     Past Medical History:  Diagnosis Date   Anxiety    Back pain    Depression    Fear of other medical care    GERD (gastroesophageal reflux disease)    Iron deficiency anemia    Joint pain    Mixed hyperlipidemia     Past Surgical History:  Procedure  Laterality Date   CESAREAN SECTION      Family History  Problem Relation Age of Onset   Hyperlipidemia Mother    Hypertension Mother    Cancer Father    Early death Father    Hyperlipidemia Father    Hypertension Father    Stroke Father    Diabetes Father    Kidney disease Father    Asthma Brother    Depression Brother        Died by suicide at age 74   COPD Maternal Grandmother    Alcohol abuse Maternal Grandfather    Cancer Maternal Grandfather    Early death Maternal Grandfather    Hyperlipidemia Maternal Grandfather    Alcohol abuse Paternal Grandmother    COPD Paternal Grandmother    Alcohol abuse Paternal Grandfather     Social History   Tobacco Use   Smoking status: Never   Smokeless tobacco: Never  Substance Use Topics   Alcohol use: Not Currently   Drug use: Not Currently     No Known Allergies  Review of Systems NEGATIVE UNLESS OTHERWISE INDICATED IN HPI      Objective:     BP (!) 160/78   Pulse 85   Temp 98.1 F (36.7 C) (Oral)   Ht 5' (1.524 m)   Wt 186 lb (84.4 kg)   SpO2 99%   BMI 36.33 kg/m   Wt Readings from Last 3 Encounters:  12/21/23 186 lb (84.4 kg)  07/03/23 182 lb 8 oz (82.8 kg)  06/23/23 180 lb (81.6 kg)    BP Readings from Last 3 Encounters:  12/21/23 (!) 160/78  07/03/23 (!) 172/82  06/23/23 (!) 142/86     Physical Exam Vitals and nursing note reviewed.  Constitutional:      General: She is not in acute distress.    Appearance: Normal appearance. She is obese. She is not ill-appearing.  HENT:     Head: Normocephalic.     Right Ear: External ear normal.     Left Ear: External ear normal.  Eyes:     Extraocular Movements: Extraocular movements intact.     Conjunctiva/sclera: Conjunctivae normal.     Pupils: Pupils are equal, round, and reactive to light.  Cardiovascular:     Rate and Rhythm: Normal rate and regular rhythm.     Pulses: Normal pulses.     Heart sounds: Normal heart sounds. No murmur  heard. Pulmonary:     Effort: Pulmonary effort is normal. No respiratory distress.     Breath sounds: Normal breath sounds. No wheezing.  Musculoskeletal:     Cervical back: Normal range of motion.  Skin:    General: Skin is warm.  Neurological:     Mental Status: She is alert and oriented to person, place, and time.  Psychiatric:        Mood and Affect: Mood normal.        Behavior: Behavior normal.             Mekenna Finau M Bexlee Bergdoll, PA-C

## 2023-12-21 NOTE — Patient Instructions (Signed)
  VISIT SUMMARY: Today, you had a follow-up visit to review your ongoing health conditions, including hypertension, mixed hyperlipidemia, prediabetes, GERD, anxiety, and depression. We discussed your recent dietary changes and your current medication regimen.  YOUR PLAN: HYPERTENSION: Your blood pressure is being managed with lisinopril , but it tends to be high during office visits due to anxiety. -Continue taking lisinopril  20 mg daily. -Monitor your blood pressure at home with a target of 130/80 mmHg or less.  MIXED HYPERLIPIDEMIA: Your cholesterol levels are well-controlled with your current medication. -Continue taking Lipitor 10 mg daily.  PREDIABETES: You have made positive dietary changes to reduce sugar intake and are encouraged to continue these along with physical activity. -Continue with your lifestyle modifications, including dietary changes and physical activity.  GASTROESOPHAGEAL REFLUX DISEASE (GERD): Your GERD symptoms are managed with your current medication. -Continue taking omeprazole  20 mg daily as needed.  ANXIETY DISORDER AND DEPRESSION: Your mental health is stable with your current medications. -Continue taking Wellbutrin  XL 300 mg daily. -Continue taking Buspar  15 mg three times daily. -Continue taking Prozac  20 mg daily.  FEAR OF MEDICAL CARE (WHITE COAT SYNDROME): Your anxiety about medical visits is affecting your blood pressure readings. -Monitor your blood pressure at home to help reduce anxiety during office visits.                      Contains text generated by Abridge.                                 Contains text generated by Abridge.

## 2024-01-02 ENCOUNTER — Telehealth: Payer: Self-pay | Admitting: Adult Health

## 2024-01-02 NOTE — Telephone Encounter (Signed)
 [facility] cancellation

## 2024-01-03 ENCOUNTER — Ambulatory Visit: Admitting: Adult Health

## 2024-01-03 NOTE — Telephone Encounter (Signed)
 Pt called and r/s her appointment

## 2024-02-19 ENCOUNTER — Encounter: Payer: Self-pay | Admitting: Adult Health

## 2024-02-19 ENCOUNTER — Ambulatory Visit: Admitting: Adult Health

## 2024-02-19 VITALS — BP 154/89 | HR 89

## 2024-02-19 DIAGNOSIS — G4733 Obstructive sleep apnea (adult) (pediatric): Secondary | ICD-10-CM | POA: Diagnosis not present
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
## 2024-02-20 NOTE — Progress Notes (Addendum)
 Community message has been sent to [facility] for pressure and supplies on 05/23/23. DD

## 2024-05-22 ENCOUNTER — Other Ambulatory Visit: Payer: Self-pay | Admitting: Physician Assistant

## 2024-06-21 ENCOUNTER — Ambulatory Visit: Admitting: Physician Assistant

## 2025-02-20 ENCOUNTER — Telehealth: Admitting: Adult Health
